# Patient Record
Sex: Male | Born: 1975 | Marital: Single | Smoking: Never smoker
Health system: Southern US, Community
[De-identification: ages and names within clinical notes are randomized; demographics above are authoritative.]

## PROBLEM LIST (undated history)

## (undated) DIAGNOSIS — I82409 Acute embolism and thrombosis of unspecified deep veins of unspecified lower extremity: Secondary | ICD-10-CM

## (undated) DIAGNOSIS — I2699 Other pulmonary embolism without acute cor pulmonale: Secondary | ICD-10-CM

## (undated) HISTORY — PX: APPENDECTOMY: SHX54

## (undated) HISTORY — PX: KNEE SURGERY: SHX244

---

## 2013-02-27 ENCOUNTER — Encounter (HOSPITAL_BASED_OUTPATIENT_CLINIC_OR_DEPARTMENT_OTHER): Payer: Self-pay | Admitting: Emergency Medicine

## 2013-02-27 ENCOUNTER — Emergency Department (HOSPITAL_BASED_OUTPATIENT_CLINIC_OR_DEPARTMENT_OTHER)
Admission: EM | Admit: 2013-02-27 | Discharge: 2013-02-27 | Disposition: A | Discharge status: Home / Self Care | Payer: BC Managed Care – PPO | Attending: Emergency Medicine | Admitting: Emergency Medicine

## 2013-02-27 DIAGNOSIS — R110 Nausea: Secondary | ICD-10-CM | POA: Insufficient documentation

## 2013-02-27 DIAGNOSIS — R209 Unspecified disturbances of skin sensation: Secondary | ICD-10-CM | POA: Insufficient documentation

## 2013-02-27 DIAGNOSIS — Z880 Allergy status to penicillin: Secondary | ICD-10-CM | POA: Insufficient documentation

## 2013-02-27 DIAGNOSIS — M62838 Other muscle spasm: Secondary | ICD-10-CM | POA: Insufficient documentation

## 2013-02-27 DIAGNOSIS — M62449 Contracture of muscle, unspecified hand: Secondary | ICD-10-CM

## 2013-02-27 LAB — BASIC METABOLIC PANEL
BUN: 17 mg/dL (ref 6–23)
CHLORIDE: 101 meq/L (ref 96–112)
CO2: 27 meq/L (ref 19–32)
CREATININE: 1.1 mg/dL (ref 0.50–1.35)
Calcium: 9.5 mg/dL (ref 8.4–10.5)
GFR calc Af Amer: >90 mL/min (ref >90)
GFR calc non Af Amer: 85 mL/min — ABNORMAL LOW (ref >90)
Glucose, Bld: 109 mg/dL — ABNORMAL HIGH (ref 70–99)
POTASSIUM: 4.1 meq/L (ref 3.7–5.3)
Sodium: 141 mEq/L (ref 137–147)

## 2013-02-27 MED ORDER — PROMETHAZINE HCL 25 MG PO TABS: 25.0000 mg | ORAL_TABLET | Freq: Four times a day (QID) | ORAL | Status: DC | PRN

## 2013-02-27 MED ORDER — ONDANSETRON 4 MG PO TBDP
4.0000 mg | ORAL_TABLET | Freq: Once | ORAL | Status: AC
  Administered 2013-02-27: 4 mg via ORAL
  Filled 2013-02-27: qty 1

## 2013-02-27 NOTE — Discharge Instructions (Signed)
Muscle Cramps and Spasms Muscle cramps and spasms occur when a muscle or muscles tighten and you have no control over this tightening (involuntary muscle contraction). They are a common problem and can develop in any muscle. The most common place is in the calf muscles of the leg. Both muscle cramps and muscle spasms are involuntary muscle contractions, but they also have differences:   Muscle cramps are sporadic and painful. They may last a few seconds to a quarter of an hour. Muscle cramps are often more forceful and last longer than muscle spasms.  Muscle spasms may or may not be painful. They may also last just a few seconds or much longer. CAUSES  It is uncommon for cramps or spasms to be due to a serious underlying problem. In many cases, the cause of cramps or spasms is unknown. Some common causes are:   Overexertion.   Overuse from repetitive motions (doing the same thing over and over).   Remaining in a certain position for a long period of time.   Improper preparation, form, or technique while performing a sport or activity.   Dehydration.   Injury.   Side effects of some medicines.   Abnormally low levels of the salts and ions in your blood (electrolytes), especially potassium and calcium. This could happen if you are taking water pills (diuretics) or you are pregnant.  Some underlying medical problems can make it more likely to develop cramps or spasms. These include, but are not limited to:   Diabetes.   Parkinson disease.   Hormone disorders, such as thyroid problems.   Alcohol abuse.   Diseases specific to muscles, joints, and bones.   Blood vessel disease where not enough blood is getting to the muscles.  HOME CARE INSTRUCTIONS   Stay well hydrated. Drink enough water and fluids to keep your urine clear or pale yellow.  It may be helpful to massage, stretch, and relax the affected muscle.  For tight or tense muscles, use a warm towel, heating  pad, or hot shower water directed to the affected area.  If you are sore or have pain after a cramp or spasm, applying ice to the affected area may relieve discomfort.  Put ice in a plastic bag.  Place a towel between your skin and the bag.  Leave the ice on for 15-20 minutes, 03-04 times a day.  Medicines used to treat a known cause of cramps or spasms may help reduce their frequency or severity. Only take over-the-counter or prescription medicines as directed by your caregiver. SEEK MEDICAL CARE IF:  Your cramps or spasms get more severe, more frequent, or do not improve over time.  MAKE SURE YOU:   Understand these instructions.  Will watch your condition.  Will get help right away if you are not doing well or get worse. Document Released: 06/18/2001 Document Revised: 04/23/2012 Document Reviewed: 12/14/2011 [facility] Patient Information 2014 [place], [place].  Nausea, Adult Nausea is the feeling that you have an upset stomach or have to vomit. Nausea by itself is not likely a serious concern, but it may be an early sign of more serious medical problems. As nausea gets worse, it can lead to vomiting. If vomiting develops, there is the risk of dehydration.  CAUSES   Viral infections.  Food poisoning.  Medicines.  Pregnancy.  Motion sickness.  Migraine headaches.  Emotional distress.  Severe pain from any source.  Alcohol intoxication. HOME CARE INSTRUCTIONS  Get plenty of rest.  Ask your caregiver  about specific rehydration instructions.  Eat small amounts of food and sip liquids more often.  Take all medicines as told by your caregiver. SEEK MEDICAL CARE IF:  You have not improved after 2 days, or you get worse.  You have a headache. SEEK IMMEDIATE MEDICAL CARE IF:   You have a fever.  You faint.  You keep vomiting or have blood in your vomit.  You are extremely weak or dehydrated.  You have dark or bloody stools.  You have severe chest or  abdominal pain. MAKE SURE YOU:  Understand these instructions.  Will watch your condition.  Will get help right away if you are not doing well or get worse. Document Released: 02/04/2004 Document Revised: 09/21/2011 Document Reviewed: 09/08/2010 [facility] Patient Information 2014 [place].

## 2013-02-27 NOTE — ED Notes (Signed)
Pt states woke up at 3a felt nauseated, no vomiting or diarrhea noted just felt like it. States became clammy and both hands/fingers locked up, states was breathing heavy; states EMS came out and VSS; pt states having nausea only at this time and feels cold; no distress noted

## 2013-02-27 NOTE — ED Provider Notes (Signed)
CSN: [id number]     Arrival date & time 02/27/13  0325 History   First MD Initiated Contact with Patient 02/27/13 [phone]     Chief Complaint  Patient presents with  . Nausea     (Consider location/radiation/quality/duration/timing/severity/associated sxs/prior Treatment) HPI Comments: Pt states woke up at 3a felt nauseated, no vomiting or diarrhea noted just felt like it. States became clammy and both hands/fingers locked up and he had some numbness. His sx lasted for few minutes - and wife called EMS. No hx of same in the past. No abd pain, no headaches, neck pain, and the sensation was limited to hands only.   The history is provided by the patient.    History reviewed. No pertinent past medical history. Past Surgical History  Procedure Laterality Date  . Appendectomy     No family history on file. History  Substance Use Topics  . Smoking status: Never Smoker   . Smokeless tobacco: Not on file  . Alcohol Use: No    Review of Systems  Constitutional: Negative for chills.  Gastrointestinal: Positive for nausea. Negative for vomiting and anal bleeding.  Musculoskeletal: Negative for back pain, myalgias, neck pain and neck stiffness.  Allergic/Immunologic: Negative for immunocompromised state.  Neurological: Positive for numbness. Negative for headaches.      Allergies  Penicillins  Home Medications  No current outpatient prescriptions on file. BP 122/82  Pulse 92  Temp(Src) 98.9 F (37.2 C) (Oral)  Resp 18  Ht 6\' 2"  (1.88 m)  Wt 225 lb (102.059 kg)  BMI 28.88 kg/m2  SpO2 100% Physical Exam  Nursing note and vitals reviewed. Constitutional: He appears well-nourished.  HENT:  Head: Atraumatic.  Eyes: Conjunctivae are normal.  Neck: Neck supple.  Cardiovascular: Normal rate.   Pulmonary/Chest: Breath sounds normal. No respiratory distress.  Abdominal: Soft. There is no tenderness.  Musculoskeletal:  gross sensory exam and grip strength of the hands are  normal.    ED Course  Procedures (including critical care time) Labs Review Labs Reviewed  BASIC METABOLIC PANEL - Abnormal; Notable for the following:    Glucose, Bld 109 (*)    GFR calc non Af Amer 85 (*)    All other components within normal limits   Imaging Review No results found.  EKG Interpretation   None       MDM   Final diagnoses:  None    Pt had an isolated episode of feeling nauseated, with some hand numbness and contracture. He is sx free, no repeat episode. Still feels nauseated. Neuromuscular exam normal. Lytes are normal. Will d.c  Derwood KaplanAnkit Livie Vanderhoof, MD 02/27/13 [phone]

## 2013-02-27 NOTE — ED Notes (Signed)
MD at bedside. 

## 2019-11-27 ENCOUNTER — Emergency Department (HOSPITAL_BASED_OUTPATIENT_CLINIC_OR_DEPARTMENT_OTHER): Payer: 59

## 2019-11-27 ENCOUNTER — Inpatient Hospital Stay (HOSPITAL_BASED_OUTPATIENT_CLINIC_OR_DEPARTMENT_OTHER)
Admission: EM | Admit: 2019-11-27 | Discharge: 2019-11-30 | DRG: 176 | Disposition: A | Discharge status: Home / Self Care | Payer: 59 | Source: Other Acute Inpatient Hospital | Attending: Internal Medicine | Admitting: Internal Medicine

## 2019-11-27 ENCOUNTER — Other Ambulatory Visit: Payer: Self-pay

## 2019-11-27 ENCOUNTER — Encounter (HOSPITAL_BASED_OUTPATIENT_CLINIC_OR_DEPARTMENT_OTHER): Payer: Self-pay | Admitting: *Deleted

## 2019-11-27 DIAGNOSIS — I2692 Saddle embolus of pulmonary artery without acute cor pulmonale: Principal | ICD-10-CM

## 2019-11-27 DIAGNOSIS — K219 Gastro-esophageal reflux disease without esophagitis: Secondary | ICD-10-CM | POA: Diagnosis present

## 2019-11-27 DIAGNOSIS — R7989 Other specified abnormal findings of blood chemistry: Secondary | ICD-10-CM | POA: Diagnosis present

## 2019-11-27 DIAGNOSIS — I82431 Acute embolism and thrombosis of right popliteal vein: Secondary | ICD-10-CM | POA: Diagnosis present

## 2019-11-27 DIAGNOSIS — I82411 Acute embolism and thrombosis of right femoral vein: Secondary | ICD-10-CM | POA: Diagnosis present

## 2019-11-27 DIAGNOSIS — L309 Dermatitis, unspecified: Secondary | ICD-10-CM | POA: Diagnosis present

## 2019-11-27 DIAGNOSIS — Z96651 Presence of right artificial knee joint: Secondary | ICD-10-CM | POA: Diagnosis present

## 2019-11-27 DIAGNOSIS — Z20822 Contact with and (suspected) exposure to covid-19: Secondary | ICD-10-CM | POA: Diagnosis present

## 2019-11-27 DIAGNOSIS — I2699 Other pulmonary embolism without acute cor pulmonale: Secondary | ICD-10-CM | POA: Diagnosis present

## 2019-11-27 DIAGNOSIS — E876 Hypokalemia: Secondary | ICD-10-CM | POA: Diagnosis not present

## 2019-11-27 DIAGNOSIS — Z880 Allergy status to penicillin: Secondary | ICD-10-CM

## 2019-11-27 DIAGNOSIS — R778 Other specified abnormalities of plasma proteins: Secondary | ICD-10-CM | POA: Diagnosis present

## 2019-11-27 LAB — BASIC METABOLIC PANEL
Anion gap: 12 (ref 5–15)
BUN: 14 mg/dL (ref 6–20)
CO2: 26 mmol/L (ref 22–32)
Calcium: 8.7 mg/dL — ABNORMAL LOW (ref 8.9–10.3)
Chloride: 101 mmol/L (ref 98–111)
Creatinine, Ser: 1.03 mg/dL (ref 0.61–1.24)
GFR, Estimated: >60 mL/min (ref >60)
Glucose, Bld: 103 mg/dL — ABNORMAL HIGH (ref 70–99)
Potassium: 3.4 mmol/L — ABNORMAL LOW (ref 3.5–5.1)
Sodium: 139 mmol/L (ref 135–145)

## 2019-11-27 LAB — CBC WITH DIFFERENTIAL/PLATELET
Abs Immature Granulocytes: 0.02 10*3/uL (ref 0.00–0.07)
Basophils Absolute: 0.1 10*3/uL (ref 0.0–0.1)
Basophils Relative: 1 %
Eosinophils Absolute: 0.4 10*3/uL (ref 0.0–0.5)
Eosinophils Relative: 6 %
HCT: 43.9 % (ref 39.0–52.0)
Hemoglobin: 14.7 g/dL (ref 13.0–17.0)
Immature Granulocytes: 0 %
Lymphocytes Relative: 28 %
Lymphs Abs: 2 10*3/uL (ref 0.7–4.0)
MCH: 28.2 pg (ref 26.0–34.0)
MCHC: 33.5 g/dL (ref 30.0–36.0)
MCV: 84.1 fL (ref 80.0–100.0)
Monocytes Absolute: 0.7 10*3/uL (ref 0.1–1.0)
Monocytes Relative: 10 %
Neutro Abs: 4.1 10*3/uL (ref 1.7–7.7)
Neutrophils Relative %: 55 %
Platelets: 230 10*3/uL (ref 150–400)
RBC: 5.22 MIL/uL (ref 4.22–5.81)
RDW: 12.2 % (ref 11.5–15.5)
WBC: 7.3 10*3/uL (ref 4.0–10.5)
nRBC: 0 % (ref 0.0–0.2)

## 2019-11-27 LAB — BRAIN NATRIURETIC PEPTIDE: B Natriuretic Peptide: 29.9 pg/mL (ref 0.0–100.0)

## 2019-11-27 LAB — RESPIRATORY PANEL BY RT PCR (FLU A&B, COVID)
Influenza A by PCR: NEGATIVE
Influenza B by PCR: NEGATIVE
SARS Coronavirus 2 by RT PCR: NEGATIVE

## 2019-11-27 LAB — LACTIC ACID, PLASMA: Lactic Acid, Venous: 0.8 mmol/L (ref 0.5–1.9)

## 2019-11-27 LAB — TROPONIN I (HIGH SENSITIVITY)
Troponin I (High Sensitivity): 39 ng/L — ABNORMAL HIGH (ref <18)
Troponin I (High Sensitivity): 33 ng/L — ABNORMAL HIGH (ref <18)

## 2019-11-27 LAB — PROTIME-INR
INR: 0.9 (ref 0.8–1.2)
Prothrombin Time: 12.1 seconds (ref 11.4–15.2)

## 2019-11-27 MED ORDER — HEPARIN (PORCINE) 25000 UT/250ML-% IV SOLN
1950.0000 [IU]/h | INTRAVENOUS | Status: AC
  Administered 2019-11-27 – 2019-11-30 (×5): 1950 [IU]/h via INTRAVENOUS
  Filled 2019-11-27 (×5): qty 250

## 2019-11-27 MED ORDER — HEPARIN BOLUS VIA INFUSION
5000.0000 [IU] | Freq: Once | INTRAVENOUS | Status: AC
  Administered 2019-11-27: 5000 [IU] via INTRAVENOUS

## 2019-11-27 MED ORDER — IOHEXOL 350 MG/ML SOLN
100.0000 mL | Freq: Once | INTRAVENOUS | Status: AC | PRN
  Administered 2019-11-27: 100 mL via INTRAVENOUS

## 2019-11-27 NOTE — ED Triage Notes (Signed)
C/o right leg swelling  And calf pain   X 3 days

## 2019-11-27 NOTE — ED Notes (Signed)
RT assessed patient in lobby due to SOB complaint. No SOB noted, stated it has been going on for 2 days. SAT 98%, HR 98. RT to monitor as needed

## 2019-11-27 NOTE — Progress Notes (Signed)
ANTICOAGULATION CONSULT NOTE - Follow Up Consult  Pharmacy Consult for Heparin Indication: pulmonary embolus  Allergies  Allergen Reactions  . Penicillins Rash    Patient Measurements: Height: 6\' 2"  (188 cm) Weight: 112.5 kg (248 lb) IBW/kg (Calculated) : 82.2 Heparin Dosing Weight: 109 kg  Vital Signs: Temp: 98.1 F (36.7 C) (11/17 1841) Temp Source: Oral (11/17 1841) BP: 126/96 (11/17 2030) Pulse Rate: 87 (11/17 2030)  Labs: Recent Labs    11/27/19 1953 11/27/19 1954  HGB 14.7  --   HCT 43.9  --   PLT 230  --   LABPROT 12.1  --   INR 0.9  --   CREATININE 1.03  --   TROPONINIHS  --  39*    Estimated Creatinine Clearance: 124.6 mL/min (by C-G formula based on SCr of 1.03 mg/dL).  Assessment: 44 year old male to begin heparin for pulmonary embolus.  Goal of Therapy:  Heparin level 0.3-0.7 units/ml Monitor platelets by anticoagulation protocol: Yes   Plan:  Heparin 5000 units iv bolus x 1 Heparin drip at 1950 units / hr  Heparin level and CBC 6 hours after heparin begins Daily heparin level, CBC  Thank you 45, PharmD [phone]  [phone] 11/27/2019,9:43 PM

## 2019-11-27 NOTE — Progress Notes (Signed)
   Call from ER Dr Stevie Kern - to CCM MD on call to help triage   PESI score 1. No syncope.   CTA - RV?LV ratio 1.3   A/P PE  - radiologicially submassive. Clinically PESI 1 - lowest risk  Plan  - ok for IV heparin gtt for atlest 3 days (to be revaluated on daily basis)( and then DOAC - lysis/intervention as rescue  - definitely recommend BNP, trop, lactate monitoring first 24h  - can go to triad  - if declines ICU      SIGNATURE    Dr. Kalman Shan, M.D., F.C.C.P,  Pulmonary and Critical Care Medicine Staff Physician, [facility] Center Director - Interstitial Lung Disease  Program  Pulmonary Fibrosis [facility] at [facility] [place], [place], [address]  Pager: [phone], If no answer  OR between  19:00-7:00h: page [phone] Telephone (clinical office): [phone] Telephone (research): [phone]  9:41 PM 11/27/2019

## 2019-11-27 NOTE — ED Provider Notes (Signed)
[facility] EMERGENCY DEPARTMENT Provider Note   CSN: [id number] Arrival date & time: 11/27/19  1829     History Chief Complaint  Patient presents with  . Leg Swelling    Peter Thomas is a 44 y.o. male.  Presents to ER with concern for leg swelling.  Patient reports over the last 3 days or so he has noted some leg swelling and calf pain.  Worse with movement, no alleviating factors.  Has not taken any medicine for it, relatively mild and achy sensation.  Also says that he has felt more short of breath lately.  Breathing worse with exertion, has slight chest pain when he takes a deep breath but no chest pain at rest, no shortness of breath at rest currently.  Had knee arthroscopic surgery over the summer.  No recent long plane rides, car rides.  Has a desk job.  Denies family history of clotting disorder.  Non-smoker.  Denies any personal medical problems.  HPI     History reviewed. No pertinent past medical history.  Patient Active Problem List   Diagnosis Date Noted  . Acute pulmonary embolism (HCC) 11/27/2019    Past Surgical History:  Procedure Laterality Date  . APPENDECTOMY         No family history on file.  Social History   Tobacco Use  . Smoking status: Never Smoker  . Smokeless tobacco: Never Used  Substance Use Topics  . Alcohol use: No  . Drug use: Not on file    Home Medications Prior to Admission medications   Medication Sig Start Date End Date Taking? Authorizing Provider  promethazine (PHENERGAN) 25 MG tablet Take 1 tablet (25 mg total) by mouth every 6 (six) hours as needed for nausea. 02/27/13   Derwood Kaplan, MD    Allergies    Penicillins  Review of Systems   Review of Systems  Constitutional: Negative for chills and fever.  HENT: Negative for ear pain and sore throat.   Eyes: Negative for pain and visual disturbance.  Respiratory: Positive for cough, chest tightness and shortness of breath.   Cardiovascular: Positive for  chest pain and leg swelling. Negative for palpitations.  Gastrointestinal: Negative for abdominal pain and vomiting.  Genitourinary: Negative for dysuria and hematuria.  Musculoskeletal: Negative for arthralgias and back pain.  Skin: Negative for color change and rash.  Neurological: Negative for seizures and syncope.  All other systems reviewed and are negative.   Physical Exam Updated Vital Signs BP (!) 125/109 (BP Location: Left Arm)   Pulse 88   Temp 98.1 F (36.7 C) (Oral)   Resp 19   Ht 6\' 2"  (1.88 m)   Wt 112.5 kg   SpO2 99%   BMI 31.84 kg/m   Physical Exam Vitals and nursing note reviewed.  Constitutional:      Appearance: He is well-developed.  HENT:     Head: Normocephalic and atraumatic.  Eyes:     Conjunctiva/sclera: Conjunctivae normal.  Cardiovascular:     Rate and Rhythm: Normal rate and regular rhythm.     Heart sounds: No murmur heard.   Pulmonary:     Effort: Pulmonary effort is normal. No respiratory distress.     Breath sounds: Normal breath sounds.  Abdominal:     Palpations: Abdomen is soft.     Tenderness: There is no abdominal tenderness.  Musculoskeletal:     Cervical back: Neck supple.     Comments: Mild swelling to the right leg, worse in  right calf, normal DP/PT pulses, sensation intact distal toes  Skin:    General: Skin is warm and dry.  Neurological:     General: No focal deficit present.     Mental Status: He is alert.     ED Results / Procedures / Treatments   Labs (all labs ordered are listed, but only abnormal results are displayed) Labs Reviewed  BASIC METABOLIC PANEL - Abnormal; Notable for the following components:      Result Value   Potassium 3.4 (*)    Glucose, Bld 103 (*)    Calcium 8.7 (*)    All other components within normal limits  TROPONIN I (HIGH SENSITIVITY) - Abnormal; Notable for the following components:   Troponin I (High Sensitivity) 39 (*)    All other components within normal limits  TROPONIN I  (HIGH SENSITIVITY) - Abnormal; Notable for the following components:   Troponin I (High Sensitivity) 33 (*)    All other components within normal limits  RESPIRATORY PANEL BY RT PCR (FLU A&B, COVID)  CBC WITH DIFFERENTIAL/PLATELET  BRAIN NATRIURETIC PEPTIDE  PROTIME-INR  HEPARIN LEVEL (UNFRACTIONATED)  CBC  LACTIC ACID, PLASMA  LACTIC ACID, PLASMA    EKG EKG Interpretation  Date/Time:  Wednesday November 27 2019 19:58:05 EST Ventricular Rate:  87 PR Interval:  156 QRS Duration: 90 QT Interval:  372 QTC Calculation: 447 R Axis:   87 Text Interpretation: Normal sinus rhythm Normal ECG Confirmed by Marianna Fuss ([id number]) on 11/27/2019 9:01:05 PM   Radiology CT Angio Chest PE W and/or Wo Contrast  Result Date: 11/27/2019 CLINICAL DATA:  Right leg swelling, calf pain EXAM: CT ANGIOGRAPHY CHEST WITH CONTRAST TECHNIQUE: Multidetector CT imaging of the chest was performed using the standard protocol during bolus administration of intravenous contrast. Multiplanar CT image reconstructions and MIPs were obtained to evaluate the vascular anatomy. CONTRAST:  OMNIPAQUE IOHEXOL 350 MG/ML SOLN COMPARISON:  None. FINDINGS: Cardiovascular: Large saddle embolus noted. Emboli extend into left upper lobe and right lower lobe. Emboli also seen in the right upper lobe. Evidence of right heart strain within RV/LV ratio of 1.3. Aorta normal caliber. Mediastinum/Nodes: No mediastinal, hilar, or axillary adenopathy. Trachea and esophagus are unremarkable. Thyroid unremarkable. Lungs/Pleura: No confluent opacities or effusions. Upper Abdomen: Imaging into the upper abdomen demonstrates no acute findings. Musculoskeletal: Chest wall soft tissues are unremarkable. No acute bony abnormality. Review of the MIP images confirms the above findings. IMPRESSION: Large saddle embolus with CT evidence of right heart strain (RV/LV Ratio = 1.3) consistent with at least submassive (intermediate risk) PE. The presence  of right heart strain has been associated with an increased risk of morbidity and mortality. Critical Value/emergent results were called by telephone at the time of interpretation on 11/27/2019 at 9:23 pm to provider [facility] , who verbally acknowledged these results. Electronically Signed   By: Charlett Nose M.D.   On: 11/27/2019 21:24   US Venous Img Lower Unilateral Right  Result Date: 11/27/2019 CLINICAL DATA:  Right lower extremity pain EXAM: RIGHT LOWER EXTREMITY VENOUS DOPPLER ULTRASOUND TECHNIQUE: Gray-scale sonography with graded compression, as well as color Doppler and duplex ultrasound were performed to evaluate the lower extremity deep venous systems from the level of the common femoral vein and including the common femoral, femoral, profunda femoral, popliteal and calf veins including the posterior tibial, peroneal and gastrocnemius veins when visible. The superficial great saphenous vein was also interrogated. Spectral Doppler was utilized to evaluate flow at rest and with distal augmentation  maneuvers in the common femoral, femoral and popliteal veins. COMPARISON:  None. FINDINGS: Contralateral Common Femoral Vein: Respiratory phasicity is normal and symmetric with the symptomatic side. No evidence of thrombus. Normal compressibility. Common Femoral Vein: No evidence of thrombus. Normal compressibility, respiratory phasicity and response to augmentation. Saphenofemoral Junction: No evidence of thrombus. Normal compressibility and flow on color Doppler imaging. Profunda Femoral Vein: No evidence of thrombus. Normal compressibility and flow on color Doppler imaging. Femoral Vein: Occlusive thrombus seen throughout the right femoral vein. Popliteal Vein: Occlusive thrombus seen throughout the right popliteal vein. Calf Veins: Not well visualized. Superficial Great Saphenous Vein: No evidence of thrombus. Normal compressibility. Venous Reflux:  None. Other Findings:  None. IMPRESSION: Right  lower extremity DVT involving the right femoral vein and popliteal vein. Electronically Signed   By: Charlett Nose M.D.   On: 11/27/2019 19:51    Procedures .Critical Care Performed by: Milagros Loll, MD Authorized by: Milagros Loll, MD   Critical care provider statement:    Critical care time (minutes):  46   Critical care was necessary to treat or prevent imminent or life-threatening deterioration of the following conditions:  Respiratory failure, circulatory failure and cardiac failure   Critical care was time spent personally by me on the following activities:  Discussions with consultants, evaluation of patient's response to treatment, examination of patient, ordering and performing treatments and interventions, ordering and review of laboratory studies, ordering and review of radiographic studies, pulse oximetry, re-evaluation of patient's condition, obtaining history from patient or surrogate and review of old charts   (including critical care time)  Medications Ordered in ED Medications  heparin ADULT infusion 100 units/mL (25000 units/275mL sodium chloride 0.45%) (1,950 Units/hr Intravenous New Bag/Given 11/27/19 2207)  iohexol (OMNIPAQUE) 350 MG/ML injection 100 mL (100 mLs Intravenous Contrast Given 11/27/19 2101)  heparin bolus via infusion 5,000 Units (5,000 Units Intravenous Bolus from Bag 11/27/19 2208)    ED Course  I have reviewed the triage vital signs and the nursing notes.  Pertinent labs & imaging results that were available during my care of the patient were reviewed by me and considered in my medical decision making (see chart for details).  Clinical Course as of Nov 27 2323  Wed Nov 27, 2019  2112 PE on CT - will start heparin, d/w pulm, likely admit hosp   [RD]  2124 D/w rad, will d/w pulm   [RD]    Clinical Course User Index [RD] Milagros Loll, MD   MDM Rules/Calculators/A&P                         44 year old male presents to ER with  concern for right leg swelling, on review of systems also endorsed shortness of breath.  Ultrasound positive for DVT in right femoral and popliteal veins.  CTA chest was positive for saddle embolism with some signs of right heart strain.  Troponin mildly elevated, no elevation in BNP.  Will start on heparin.  Will discuss with pulmonology.  Pulm recommends just heparin for now, admit [facility].  [facility] will accept.  Final Clinical Impression(s) / ED Diagnoses Final diagnoses:  Acute saddle pulmonary embolism without acute cor pulmonale (HCC)    Rx / DC Orders ED Discharge Orders    None       Milagros Loll, MD 11/27/19 2325

## 2019-11-28 ENCOUNTER — Inpatient Hospital Stay (HOSPITAL_COMMUNITY): Payer: 59

## 2019-11-28 ENCOUNTER — Encounter (HOSPITAL_COMMUNITY): Payer: Self-pay | Admitting: Family Medicine

## 2019-11-28 DIAGNOSIS — R778 Other specified abnormalities of plasma proteins: Secondary | ICD-10-CM

## 2019-11-28 DIAGNOSIS — K219 Gastro-esophageal reflux disease without esophagitis: Secondary | ICD-10-CM | POA: Diagnosis not present

## 2019-11-28 DIAGNOSIS — R7989 Other specified abnormal findings of blood chemistry: Secondary | ICD-10-CM | POA: Diagnosis present

## 2019-11-28 DIAGNOSIS — Z20822 Contact with and (suspected) exposure to covid-19: Secondary | ICD-10-CM | POA: Diagnosis present

## 2019-11-28 DIAGNOSIS — I2692 Saddle embolus of pulmonary artery without acute cor pulmonale: Secondary | ICD-10-CM

## 2019-11-28 DIAGNOSIS — I2699 Other pulmonary embolism without acute cor pulmonale: Secondary | ICD-10-CM | POA: Diagnosis present

## 2019-11-28 DIAGNOSIS — I82411 Acute embolism and thrombosis of right femoral vein: Secondary | ICD-10-CM

## 2019-11-28 DIAGNOSIS — L309 Dermatitis, unspecified: Secondary | ICD-10-CM | POA: Diagnosis present

## 2019-11-28 DIAGNOSIS — Z96651 Presence of right artificial knee joint: Secondary | ICD-10-CM | POA: Diagnosis present

## 2019-11-28 DIAGNOSIS — Z880 Allergy status to penicillin: Secondary | ICD-10-CM | POA: Diagnosis not present

## 2019-11-28 DIAGNOSIS — I82431 Acute embolism and thrombosis of right popliteal vein: Secondary | ICD-10-CM | POA: Diagnosis present

## 2019-11-28 DIAGNOSIS — E876 Hypokalemia: Secondary | ICD-10-CM | POA: Diagnosis not present

## 2019-11-28 LAB — COMPREHENSIVE METABOLIC PANEL
ALT: 21 U/L (ref 0–44)
AST: 17 U/L (ref 15–41)
Albumin: 3.6 g/dL (ref 3.5–5.0)
Alkaline Phosphatase: 49 U/L (ref 38–126)
Anion gap: 8 (ref 5–15)
BUN: 10 mg/dL (ref 6–20)
CO2: 26 mmol/L (ref 22–32)
Calcium: 8.9 mg/dL (ref 8.9–10.3)
Chloride: 107 mmol/L (ref 98–111)
Creatinine, Ser: 0.96 mg/dL (ref 0.61–1.24)
GFR, Estimated: >60 mL/min (ref >60)
Glucose, Bld: 149 mg/dL — ABNORMAL HIGH (ref 70–99)
Potassium: 3.4 mmol/L — ABNORMAL LOW (ref 3.5–5.1)
Sodium: 141 mmol/L (ref 135–145)
Total Bilirubin: 0.6 mg/dL (ref 0.3–1.2)
Total Protein: 6.4 g/dL — ABNORMAL LOW (ref 6.5–8.1)

## 2019-11-28 LAB — ECHOCARDIOGRAM COMPLETE
Area-P 1/2: 4.06 cm2
Calc EF: 50.3 %
Height: 74 in
S' Lateral: 4 cm
Single Plane A2C EF: 49.5 %
Single Plane A4C EF: 48.8 %
Weight: 3925.95 oz

## 2019-11-28 LAB — CBC
HCT: 41.1 % (ref 39.0–52.0)
Hemoglobin: 14.2 g/dL (ref 13.0–17.0)
MCH: 29 pg (ref 26.0–34.0)
MCHC: 34.5 g/dL (ref 30.0–36.0)
MCV: 83.9 fL (ref 80.0–100.0)
Platelets: 220 10*3/uL (ref 150–400)
RBC: 4.9 MIL/uL (ref 4.22–5.81)
RDW: 12.3 % (ref 11.5–15.5)
WBC: 7.7 10*3/uL (ref 4.0–10.5)
nRBC: 0 % (ref 0.0–0.2)

## 2019-11-28 LAB — HIV ANTIBODY (ROUTINE TESTING W REFLEX): HIV Screen 4th Generation wRfx: NONREACTIVE

## 2019-11-28 LAB — TROPONIN I (HIGH SENSITIVITY): Troponin I (High Sensitivity): 24 ng/L — ABNORMAL HIGH (ref <18)

## 2019-11-28 LAB — HEPARIN LEVEL (UNFRACTIONATED)
Heparin Unfractionated: 0.65 IU/mL (ref 0.30–0.70)
Heparin Unfractionated: 0.7 IU/mL (ref 0.30–0.70)

## 2019-11-28 LAB — MAGNESIUM: Magnesium: 2 mg/dL (ref 1.7–2.4)

## 2019-11-28 LAB — LACTIC ACID, PLASMA: Lactic Acid, Venous: 1 mmol/L (ref 0.5–1.9)

## 2019-11-28 MED ORDER — POTASSIUM CHLORIDE CRYS ER 20 MEQ PO TBCR
40.0000 meq | EXTENDED_RELEASE_TABLET | Freq: Once | ORAL | Status: AC
  Administered 2019-11-28: 40 meq via ORAL
  Filled 2019-11-28: qty 2

## 2019-11-28 MED ORDER — ACETAMINOPHEN 650 MG RE SUPP: 650.0000 mg | Freq: Four times a day (QID) | RECTAL | Status: DC | PRN

## 2019-11-28 MED ORDER — ACETAMINOPHEN 325 MG PO TABS: 650.0000 mg | ORAL_TABLET | Freq: Four times a day (QID) | ORAL | Status: DC | PRN

## 2019-11-28 MED ORDER — ONDANSETRON HCL 4 MG/2ML IJ SOLN: 4.0000 mg | Freq: Four times a day (QID) | INTRAMUSCULAR | Status: DC | PRN

## 2019-11-28 MED ORDER — PANTOPRAZOLE SODIUM 40 MG PO TBEC
40.0000 mg | DELAYED_RELEASE_TABLET | Freq: Every day | ORAL | Status: DC
  Administered 2019-11-28 – 2019-11-30 (×3): 40 mg via ORAL
  Filled 2019-11-28 (×3): qty 1

## 2019-11-28 MED ORDER — ONDANSETRON HCL 4 MG PO TABS: 4.0000 mg | ORAL_TABLET | Freq: Four times a day (QID) | ORAL | Status: DC | PRN

## 2019-11-28 MED ORDER — POLYETHYLENE GLYCOL 3350 17 G PO PACK: 17.0000 g | PACK | Freq: Every day | ORAL | Status: DC | PRN

## 2019-11-28 NOTE — Progress Notes (Signed)
ANTICOAGULATION CONSULT NOTE - Follow Up Consult  Pharmacy Consult for Heparin Indication: pulmonary embolus  Allergies  Allergen Reactions  . Penicillins Rash    Patient Measurements: Height: 6\' 2"  (188 cm) Weight: 111.3 kg (245 lb 6 oz) IBW/kg (Calculated) : 82.2 Heparin Dosing Weight: 109 kg  Vital Signs: Temp: 98 F (36.7 C) (11/18 0015) Temp Source: Oral (11/18 0015) BP: 128/88 (11/18 0015) Pulse Rate: 84 (11/18 0015)  Labs: Recent Labs    11/27/19 1953 11/27/19 1954 11/27/19 2202 11/28/19 0151  HGB 14.7  --   --  14.2  HCT 43.9  --   --  41.1  PLT 230  --   --  220  LABPROT 12.1  --   --   --   INR 0.9  --   --   --   HEPARINUNFRC  --   --   --  0.65  CREATININE 1.03  --   --   --   TROPONINIHS  --  39* 33*  --     Estimated Creatinine Clearance: 124 mL/min (by C-G formula based on SCr of 1.03 mg/dL).  Assessment: 44 year old male to begin heparin for pulmonary embolus  11/18 AM update:  Heparin level therapeutic x 1  CBC good  Goal of Therapy:  Heparin level 0.3-0.7 units/ml Monitor platelets by anticoagulation protocol: Yes   Plan:  Cont heparin drip at 1950 units/hr Confirmatory heparin level at 1200  12/18, PharmD, BCPS Clinical Pharmacist Phone: [phone]

## 2019-11-28 NOTE — Plan of Care (Signed)

## 2019-11-28 NOTE — Progress Notes (Signed)
Patient ID: Peter Thomas, male   DOB: Jun 01, 1975, 44 y.o.   MRN: [id number] Patient admitted early this morning for leg swelling and was found to have right lower extremity DVT and CTA chest positive for saddle embolism with some signs of right heart strain.  He was started on heparin drip.  Patient seen and examined at bedside and plan of care discussed with him.  I have reviewed patient medical records including this morning's H&P, labs, current vitals, medications myself.  Continue heparin drip for now: PCCM recommended for 3 days at least then switch to oral anticoagulation.  Repeat a.m. labs.  Check 2D echo.

## 2019-11-28 NOTE — H&P (Signed)
History and Physical    ZYEIR DYMEK IWO:[id number] DOB: April 02, 1975 DOA: 11/27/2019  PCP: Joycelyn Rua, MD  Patient coming from: Home - [facility] transfer  Chief Complaint:  Chief Complaint  Patient presents with  . Leg Swelling     HPI:    44 year old male with past medical history of gastroesophageal reflux disease and eczema who presents to [facility] as a transfer from [facility] for right lower extremity swelling found to have right lower extremity DVT as well as saddle pulmonary embolism.  Patient explains that ever since he underwent right knee arthroplasty several months ago he has been experiencing a persisting right lower extremity edema.  Patient notes that last weekend he felt quite fatigued.  By Monday, his right leg that has been swollen at least for the past several weeks began to become quite painful.  Patient describes his pain as sharp to tight in quality, severe in intensity and nonradiating.  Pain is worse with ambulation or weightbearing.  Pain persisted over the next several days.  By Tuesday, the patient began to develop mild shortness of breath, albeit only with exertion.  On Tuesday the patient presented to a local urgent care clinic where the provider told the patient he was suffering from "muscle strain" and sent the patient home.  By Wednesday, the patient began to experience substantially worsening shortness of breath both with exertion and at rest.  Patient denied associated chest pain.  Patient was experiencing some associated dry nonproductive cough as well as a headache.  Patient denies any associated hemoptysis.  Patient denies any associated substantial fevers.  Because of progressively worsening symptoms patient eventually presented to [facility] emergency department for evaluation.  Upon evaluation in the emergency department, patient underwent right lower extremity ultrasound revealing a DVT of the right femoral and  popliteal veins.  Furthermore, CT imaging of the chest revealed a large saddle pulmonary embolus with CT evidence of right heart strain consistent with at least submassive pulmonary embolism.  Patient was initiated on a heparin drip.  Case was discussed with Dr. Marchelle Gearing with CCM who did not recommend thrombolytics/thrombectomy or ICU admission at this time and recommended admitting to the hospital service.  The hospitalist service was therefore called to assess patient for admission to the hospital.  Review of Systems:   Review of Systems  Respiratory: Positive for cough and shortness of breath.   Cardiovascular: Positive for leg swelling.  Musculoskeletal: Positive for myalgias.  All other systems reviewed and are negative.   History reviewed. No pertinent past medical history.  Past Surgical History:  Procedure Laterality Date  . APPENDECTOMY       reports that he has never smoked. He has never used smokeless tobacco. He reports that he does not drink alcohol. No history on file for drug use.  Allergies  Allergen Reactions  . Penicillins Rash    Family History  Problem Relation Age of Onset  . Pulmonary embolism Neg Hx   . Cancer Neg Hx   . Heart disease Neg Hx   . Stroke Neg Hx      Prior to Admission medications   Medication Sig Start Date End Date Taking? Authorizing Provider  promethazine (PHENERGAN) 25 MG tablet Take 1 tablet (25 mg total) by mouth every 6 (six) hours as needed for nausea. 02/27/13   Derwood Kaplan, MD    Physical Exam: Vitals:   11/27/19 2330 11/28/19 0015 11/28/19 0421 11/28/19 0739  BP: Marland Kitchen)  128/98 128/88 124/82 114/88  Pulse: 89 84 99 96  Resp: (!) 21 20 19 19   Temp:  98 F (36.7 C) 98.1 F (36.7 C) 98.2 F (36.8 C)  TempSrc:  Oral Oral Oral  SpO2: 99% 98% 92% 94%  Weight:  111.3 kg    Height:  6\' 2"  (1.88 m)      Constitutional: Acute alert and oriented x3, no associated distress.   Skin: no rashes, no lesions, good skin turgor  noted. Eyes: Pupils are equally reactive to light.  No evidence of scleral icterus or conjunctival pallor.  ENMT: Moist mucous membranes noted.  Posterior pharynx clear of any exudate or lesions.   Neck: normal, supple, no masses, no thyromegaly.  No evidence of jugular venous distension.   Respiratory: clear to auscultation bilaterally, no wheezing, no crackles. Normal respiratory effort. No accessory muscle use.  Cardiovascular: Regular rate and rhythm, no murmurs / rubs / gallops.  Mild distal right lower extremity edema. 2+ pedal pulses. No carotid bruits.  Chest:   Nontender without crepitus or deformity.   Back:   Nontender without crepitus or deformity. Abdomen: Abdomen is soft and nontender.  No evidence of intra-abdominal masses.  Positive bowel sounds noted in all quadrants.   Musculoskeletal: Notable significant tenderness of the right leg, particularly in the posterior calf.  ' sign positive of the right leg.  No joint deformity upper and lower extremities. Good ROM, no contractures. Normal muscle tone.  Neurologic: CN 2-12 grossly intact. Sensation intact.  Patient moving all 4 extremities spontaneously.  Patient is following all commands.  Patient is responsive to verbal stimuli.   Psychiatric: Patient exhibits normal mood with appropriate affect.  Patient seems to possess insight as to their current situation.     Labs on Admission: I have personally reviewed following labs and imaging studies -   CBC: Recent Labs  Lab 11/27/19 1953 11/28/19 0151  WBC 7.3 7.7  NEUTROABS 4.1  --   HGB 14.7 14.2  HCT 43.9 41.1  MCV 84.1 83.9  PLT 230 220   Basic Metabolic Panel: Recent Labs  Lab 11/27/19 1953 11/28/19 0338  NA 139 141  K 3.4* 3.4*  CL 101 107  CO2 26 26  GLUCOSE 103* 149*  BUN 14 10  CREATININE 1.03 0.96  CALCIUM 8.7* 8.9  MG  --  2.0   GFR: Estimated Creatinine Clearance: 133 mL/min (by C-G formula based on SCr of 0.96 mg/dL). Liver Function  Tests: Recent Labs  Lab 11/28/19 0338  AST 17  ALT 21  ALKPHOS 49  BILITOT 0.6  PROT 6.4*  ALBUMIN 3.6   No results for input(s): LIPASE, AMYLASE in the last 168 hours. No results for input(s): AMMONIA in the last 168 hours. Coagulation Profile: Recent Labs  Lab 11/27/19 1953  INR 0.9   Cardiac Enzymes: No results for input(s): CKTOTAL, CKMB, CKMBINDEX, TROPONINI in the last 168 hours. BNP (last 3 results) No results for input(s): PROBNP in the last 8760 hours. HbA1C: No results for input(s): HGBA1C in the last 72 hours. CBG: No results for input(s): GLUCAP in the last 168 hours. Lipid Profile: No results for input(s): CHOL, HDL, LDLCALC, TRIG, CHOLHDL, LDLDIRECT in the last 72 hours. Thyroid Function Tests: No results for input(s): TSH, T4TOTAL, FREET4, T3FREE, THYROIDAB in the last 72 hours. Anemia Panel: No results for input(s): VITAMINB12, FOLATE, FERRITIN, TIBC, IRON, RETICCTPCT in the last 72 hours. Urine analysis: No results found for: COLORURINE, APPEARANCEUR, LABSPEC, PHURINE, GLUCOSEU, HGBUR,  Margarita MailBILIRUBINUR, KETONESUR, PROTEINUR, UROBILINOGEN, NITRITE, LEUKOCYTESUR  Radiological Exams on Admission - Personally Reviewed: CT Angio Chest PE W and/or Wo Contrast  Result Date: 11/27/2019 CLINICAL DATA:  Right leg swelling, calf pain EXAM: CT ANGIOGRAPHY CHEST WITH CONTRAST TECHNIQUE: Multidetector CT imaging of the chest was performed using the standard protocol during bolus administration of intravenous contrast. Multiplanar CT image reconstructions and MIPs were obtained to evaluate the vascular anatomy. CONTRAST:  100mL OMNIPAQUE IOHEXOL 350 MG/ML SOLN COMPARISON:  None. FINDINGS: Cardiovascular: Large saddle embolus noted. Emboli extend into left upper lobe and right lower lobe. Emboli also seen in the right upper lobe. Evidence of right heart strain within RV/LV ratio of 1.3. Aorta normal caliber. Mediastinum/Nodes: No mediastinal, hilar, or axillary adenopathy. Trachea  and esophagus are unremarkable. Thyroid unremarkable. Lungs/Pleura: No confluent opacities or effusions. Upper Abdomen: Imaging into the upper abdomen demonstrates no acute findings. Musculoskeletal: Chest wall soft tissues are unremarkable. No acute bony abnormality. Review of the MIP images confirms the above findings. IMPRESSION: Large saddle embolus with CT evidence of right heart strain (RV/LV Ratio = 1.3) consistent with at least submassive (intermediate risk) PE. The presence of right heart strain has been associated with an increased risk of morbidity and mortality. Critical Value/emergent results were called by telephone at the time of interpretation on 11/27/2019 at 9:23 pm to provider [facility]ARD DYKSTRA , who verbally acknowledged these results. Electronically Signed   By: Charlett NoseKevin  Dover M.D.   On: 11/27/2019 21:24   US Venous Img Lower Unilateral Right  Result Date: 11/27/2019 CLINICAL DATA:  Right lower extremity pain EXAM: RIGHT LOWER EXTREMITY VENOUS DOPPLER ULTRASOUND TECHNIQUE: Gray-scale sonography with graded compression, as well as color Doppler and duplex ultrasound were performed to evaluate the lower extremity deep venous systems from the level of the common femoral vein and including the common femoral, femoral, profunda femoral, popliteal and calf veins including the posterior tibial, peroneal and gastrocnemius veins when visible. The superficial great saphenous vein was also interrogated. Spectral Doppler was utilized to evaluate flow at rest and with distal augmentation maneuvers in the common femoral, femoral and popliteal veins. COMPARISON:  None. FINDINGS: Contralateral Common Femoral Vein: Respiratory phasicity is normal and symmetric with the symptomatic side. No evidence of thrombus. Normal compressibility. Common Femoral Vein: No evidence of thrombus. Normal compressibility, respiratory phasicity and response to augmentation. Saphenofemoral Junction: No evidence of thrombus. Normal  compressibility and flow on color Doppler imaging. Profunda Femoral Vein: No evidence of thrombus. Normal compressibility and flow on color Doppler imaging. Femoral Vein: Occlusive thrombus seen throughout the right femoral vein. Popliteal Vein: Occlusive thrombus seen throughout the right popliteal vein. Calf Veins: Not well visualized. Superficial Great Saphenous Vein: No evidence of thrombus. Normal compressibility. Venous Reflux:  None. Other Findings:  None. IMPRESSION: Right lower extremity DVT involving the right femoral vein and popliteal vein. Electronically Signed   By: Charlett NoseKevin  Dover M.D.   On: 11/27/2019 19:51    EKG: Personally reviewed.  Rhythm is normal sinus rhythm with heart rate of 87 bpm.  No dynamic ST segment changes appreciated.  Assessment/Plan Principal Problem:   Acute saddle pulmonary embolism without acute cor pulmonale (HCC)   Patient presenting with right lower extremity popliteal and femoral DVT with substantial acute saddle pulmonary embolism.  Considering the etiology, patient is complaining of at least a several week history of right lower extremity edema since he underwent arthroplasty of the right knee.  This may have served as the etiology for this thromboembolic event.  Heparin infusion has already been initiated which will be continued at this time  Per CCM recommendations, will continue patient on intravenous heparin for at least 3 days prior to transitioning patient to an oral anticoagulant  Monitoring patient in progressive unit on telemetry  Patient is currently hemodynamically stable, I agree with CCM's recommendation that patient does not necessarily need thrombectomy or thrombolytics at this time however this will be reconsidered if patient deteriorates.  Active Problems:   Acute deep vein thrombosis (DVT) of femoral vein of right lower extremity (HCC)   Please see assessment and plan above    GERD without esophagitis   Daily PPI  Patient  states he typically takes OTC regimen    Elevated troponin level not due myocardial infarction    Slight elevation of troponins likely secondary to cardiac strain from saddle embolism  Cycling cardiac enzymes  Monitoring patient on telemetry  Patient currently chest pain-free   Code Status:  Full code Family Communication: deferred   Status is: Inpatient  Remains inpatient appropriate because:IV treatments appropriate due to intensity of illness or inability to take PO and Inpatient level of care appropriate due to severity of illness   Dispo: The patient is from: Home              Anticipated d/c is to: Home              Anticipated d/c date is: > 3 days              Patient currently is not medically stable to d/c.        Marinda Elk MD [facility] Pager [phone]  If 7PM-7AM, please contact night-coverage [email or web address] Use universal [place] password for that web site. If you do not have the password, please call the hospital operator.  11/28/2019, 8:15 AM

## 2019-11-28 NOTE — Progress Notes (Signed)
ANTICOAGULATION CONSULT NOTE - Follow Up Consult  Pharmacy Consult for Heparin Indication: BL DVTs and saddle PE  Allergies  Allergen Reactions  . Penicillins Rash    Patient Measurements: Height: 6\' 2"  (188 cm) Weight: 111.3 kg (245 lb 6 oz) IBW/kg (Calculated) : 82.2 Heparin Dosing Weight:    Vital Signs: Temp: 98.2 F (36.8 C) (11/18 0739) Temp Source: Oral (11/18 0739) BP: 114/88 (11/18 0739) Pulse Rate: 96 (11/18 0739)  Labs: Recent Labs    11/27/19 1953 11/27/19 1954 11/27/19 2202 11/28/19 0151 11/28/19 0338 11/28/19 1155  HGB 14.7  --   --  14.2  --   --   HCT 43.9  --   --  41.1  --   --   PLT 230  --   --  220  --   --   LABPROT 12.1  --   --   --   --   --   INR 0.9  --   --   --   --   --   HEPARINUNFRC  --   --   --  0.65  --  0.70  CREATININE 1.03  --   --   --  0.96  --   TROPONINIHS  --  39* 33*  --  24*  --     Estimated Creatinine Clearance: 133 mL/min (by C-G formula based on SCr of 0.96 mg/dL).  Assessment: Anticoag: DVT + saddle PE. CTA - RV/LV ratio 1.3. Plan heparin x 3d then DOAC. Hep level 0.65, repeat 0.7 remains in goal  Goal of Therapy:  Heparin level 0.3-0.7 units/ml Monitor platelets by anticoagulation protocol: Yes   Plan:  Heparin drip at 1950 units / hr Daily HL and CBC   Arleth Mccullar S. 11/30/19, PharmD, BCPS Clinical Staff Pharmacist [email or web address] Merilynn Finland, Harriet Bollen Stillinger 11/28/2019,2:05 PM

## 2019-11-28 NOTE — Progress Notes (Signed)
  Echocardiogram 2D Echocardiogram has been performed.  Stark Bray Swaim 11/28/2019, 1:25 PM

## 2019-11-29 DIAGNOSIS — I82411 Acute embolism and thrombosis of right femoral vein: Secondary | ICD-10-CM

## 2019-11-29 DIAGNOSIS — K219 Gastro-esophageal reflux disease without esophagitis: Secondary | ICD-10-CM

## 2019-11-29 DIAGNOSIS — R778 Other specified abnormalities of plasma proteins: Secondary | ICD-10-CM

## 2019-11-29 LAB — HEPARIN LEVEL (UNFRACTIONATED): Heparin Unfractionated: 0.67 IU/mL (ref 0.30–0.70)

## 2019-11-29 LAB — BASIC METABOLIC PANEL
Anion gap: 11 (ref 5–15)
BUN: 11 mg/dL (ref 6–20)
CO2: 22 mmol/L (ref 22–32)
Calcium: 8.7 mg/dL — ABNORMAL LOW (ref 8.9–10.3)
Chloride: 107 mmol/L (ref 98–111)
Creatinine, Ser: 0.99 mg/dL (ref 0.61–1.24)
GFR, Estimated: >60 mL/min (ref >60)
Glucose, Bld: 104 mg/dL — ABNORMAL HIGH (ref 70–99)
Potassium: 4 mmol/L (ref 3.5–5.1)
Sodium: 140 mmol/L (ref 135–145)

## 2019-11-29 LAB — CBC
HCT: 41.7 % (ref 39.0–52.0)
Hemoglobin: 14 g/dL (ref 13.0–17.0)
MCH: 28.6 pg (ref 26.0–34.0)
MCHC: 33.6 g/dL (ref 30.0–36.0)
MCV: 85.3 fL (ref 80.0–100.0)
Platelets: 232 10*3/uL (ref 150–400)
RBC: 4.89 MIL/uL (ref 4.22–5.81)
RDW: 12.2 % (ref 11.5–15.5)
WBC: 6.5 10*3/uL (ref 4.0–10.5)
nRBC: 0 % (ref 0.0–0.2)

## 2019-11-29 LAB — MAGNESIUM: Magnesium: 1.9 mg/dL (ref 1.7–2.4)

## 2019-11-29 NOTE — Progress Notes (Signed)
Patient ID: Peter Thomas, male   DOB: 02-26-1975, 44 y.o.   MRN: [id number]  PROGRESS NOTE    Peter Thomas  WUJ:[id number] DOB: 02-26-1975 DOA: 11/27/2019 PCP: Joycelyn RuaMeyers, Stephen, MD   Brief Narrative:  44 year old male with history of GERD and eczema presented with worsening right lower extremity swelling and was found to have right lower extremity DVT as well as saddle pulmonary embolism.  Case [facility] by [facility] provider with Dr. Jocelyn Lameramaswamy/CCM who did not recommend thrombolytic/thrombectomy or ICU admission and recommended to continue heparin drip.  [facility]  Assessment & Plan:   Acute saddle pulmonary embolism without acute cor pulmonale Acute DVT of right femoral and popliteal veins -Presented with worsening right lower extremity swelling and was found to have acute right femoral and popliteal veins along with acute saddle pulmonary embolism.  Possibly provoked by right knee surgery months ago. -Case [facility] with Dr. Jocelyn Lameramaswamy/CCM who did not recommend thrombolytic/thrombectomy or ICU admission and recommended to continue heparin drip -Currently on heparin drip.  Hemodynamically stable.  Respiratory status is also stable. -2D echo showed EF of 55 to 60% - will possibly switch to oral Eliquis in the next 24 to 48 hours if remains hemodynamically stable.  Will need at least 6 months of oral anticoagulation.  Outpatient follow-up with hematology  Hypokalemia -Resolved with replacement  GERD -Continue Protonix  Elevated troponin -Troponin only slightly elevated and did not trend upwards.  Echo as below.  No chest pain.  No further work-up needed.    DVT prophylaxis: Heparin drip Code Status: Full Family Communication: None at bedside Disposition Plan: Status is: Inpatient  Remains inpatient appropriate because:Inpatient level of care appropriate due to severity of illness   Dispo: The patient is from: Home               Anticipated d/c is to: Home              Anticipated d/c date is: 1 day              Patient currently is not medically stable to d/c.   Consultants: None  Procedures: Echo as below  Antimicrobials: None   Subjective: Patient seen and examined at bedside.  Denies worsening shortness of breath, nausea, vomiting, chest pain or bleeding from any orifices.  Objective: Vitals:   11/28/19 2030 11/28/19 2358 11/29/19 0421 11/29/19 0858  BP: 126/90 119/82 112/74 125/80  Pulse: 97 90 93 82  Resp: (!) 23 20 15 16   Temp: 98.1 F (36.7 C) 98 F (36.7 C) 98.4 F (36.9 C) 97.9 F (36.6 C)  TempSrc: Oral Oral Oral Oral  SpO2: 96% 93% 94% 96%  Weight:      Height:        Intake/Output Summary (Last 24 hours) at 11/29/2019 0934 Last data filed at 11/29/2019 0400 Gross per 24 hour  Intake 697.88 ml  Output 600 ml  Net 97.88 ml   Filed Weights   11/27/19 1843 11/28/19 0015  Weight: 112.5 kg 111.3 kg    Examination:  General exam: Appears calm and comfortable  Respiratory system: Bilateral decreased breath sounds at bases Cardiovascular system: S1 & S2 heard, Rate controlled Gastrointestinal system: Abdomen is nondistended, soft and nontender. Normal bowel sounds heard. Extremities: No cyanosis, clubbing; right lower extremity edema present.  No right calf tenderness Central nervous system: Alert and oriented. No focal neurological deficits. Moving extremities Skin: No rashes, lesions or ulcers  Psychiatry: Judgement and insight appear normal. Mood & affect appropriate.     Data Reviewed: I have personally reviewed following labs and imaging studies  CBC: Recent Labs  Lab 11/27/19 1953 11/28/19 0151 11/29/19 0144  WBC 7.3 7.7 6.5  NEUTROABS 4.1  --   --   HGB 14.7 14.2 14.0  HCT 43.9 41.1 41.7  MCV 84.1 83.9 85.3  PLT 230 220 232   Basic Metabolic Panel: Recent Labs  Lab 11/27/19 1953 11/28/19 0338 11/29/19 0144  NA 139 141 140  K 3.4* 3.4* 4.0  CL 101 107  107  CO2 26 26 22   GLUCOSE 103* 149* 104*  BUN 14 10 11   CREATININE 1.03 0.96 0.99  CALCIUM 8.7* 8.9 8.7*  MG  --  2.0 1.9   GFR: Estimated Creatinine Clearance: 129 mL/min (by C-G formula based on SCr of 0.99 mg/dL). Liver Function Tests: Recent Labs  Lab 11/28/19 0338  AST 17  ALT 21  ALKPHOS 49  BILITOT 0.6  PROT 6.4*  ALBUMIN 3.6   No results for input(s): LIPASE, AMYLASE in the last 168 hours. No results for input(s): AMMONIA in the last 168 hours. Coagulation Profile: Recent Labs  Lab 11/27/19 1953  INR 0.9   Cardiac Enzymes: No results for input(s): CKTOTAL, CKMB, CKMBINDEX, TROPONINI in the last 168 hours. BNP (last 3 results) No results for input(s): PROBNP in the last 8760 hours. HbA1C: No results for input(s): HGBA1C in the last 72 hours. CBG: No results for input(s): GLUCAP in the last 168 hours. Lipid Profile: No results for input(s): CHOL, HDL, LDLCALC, TRIG, CHOLHDL, LDLDIRECT in the last 72 hours. Thyroid Function Tests: No results for input(s): TSH, T4TOTAL, FREET4, T3FREE, THYROIDAB in the last 72 hours. Anemia Panel: No results for input(s): VITAMINB12, FOLATE, FERRITIN, TIBC, IRON, RETICCTPCT in the last 72 hours. Sepsis Labs: Recent Labs  Lab 11/27/19 2329 11/28/19 2330  LATICACIDVEN 0.8 1.0    Recent Results (from the past 240 hour(s))  Respiratory Panel by RT PCR (Flu A&B, Covid) - Nasopharyngeal Swab     Status: None   Collection Time: 11/27/19  9:31 PM   Specimen: Nasopharyngeal Swab  Result Value Ref Range Status   SARS Coronavirus 2 by RT PCR NEGATIVE NEGATIVE Final    Comment: (NOTE) SARS-CoV-2 target nucleic acids are NOT DETECTED.  The SARS-CoV-2 RNA is generally detectable in upper respiratoy specimens during the acute phase of infection. The lowest concentration of SARS-CoV-2 viral copies this assay can detect is 131 copies/mL. A negative result does not preclude SARS-Cov-2 infection and should not be used as the sole  basis for treatment or other patient management decisions. A negative result may occur with  improper specimen collection/handling, submission of specimen other than nasopharyngeal swab, presence of viral mutation(s) within the areas targeted by this assay, and inadequate number of viral copies (<131 copies/mL). A negative result must be combined with clinical observations, patient history, and epidemiological information. The expected result is Negative.  Fact Sheet for Patients:  3536  Fact Sheet for Healthcare Providers:  11/29/19  This test is no t yet approved or cleared by the https://www.moore.com/ FDA and  has been authorized for detection and/or diagnosis of SARS-CoV-2 by FDA under an Emergency Use Authorization (EUA). This EUA will remain  in effect (meaning this test can be used) for the duration of the COVID-19 declaration under Section 564(b)(1) of the Act, 21 U.S.C. section 360bbb-3(b)(1), unless the authorization is terminated or revoked sooner.  Influenza A by PCR NEGATIVE NEGATIVE Final   Influenza B by PCR NEGATIVE NEGATIVE Final    Comment: (NOTE) The Xpert Xpress SARS-CoV-2/FLU/RSV assay is intended as an aid in  the diagnosis of influenza from Nasopharyngeal swab specimens and  should not be used as a sole basis for treatment. Nasal washings and  aspirates are unacceptable for Xpert Xpress SARS-CoV-2/FLU/RSV  testing.  Fact Sheet for Patients: https://www.moore.com/  Fact Sheet for Healthcare Providers: https://www.young.biz/  This test is not yet approved or cleared by the [place] FDA and  has been authorized for detection and/or diagnosis of SARS-CoV-2 by  FDA under an Emergency Use Authorization (EUA). This EUA will remain  in effect (meaning this test can be used) for the duration of the  Covid-19 declaration under Section 564(b)(1) of the  Act, 21  U.S.C. section 360bbb-3(b)(1), unless the authorization is  terminated or revoked. Performed at [facility], [address] [place], [place] [address]          Radiology Studies: CT Angio Chest PE W and/or Wo Contrast  Result Date: 11/27/2019 CLINICAL DATA:  Right leg swelling, calf pain EXAM: CT ANGIOGRAPHY CHEST WITH CONTRAST TECHNIQUE: Multidetector CT imaging of the chest was performed using the standard protocol during bolus administration of intravenous contrast. Multiplanar CT image reconstructions and MIPs were obtained to evaluate the vascular anatomy. CONTRAST:  OMNIPAQUE IOHEXOL 350 MG/ML SOLN COMPARISON:  None. FINDINGS: Cardiovascular: Large saddle embolus noted. Emboli extend into left upper lobe and right lower lobe. Emboli also seen in the right upper lobe. Evidence of right heart strain within RV/LV ratio of 1.3. Aorta normal caliber. Mediastinum/Nodes: No mediastinal, hilar, or axillary adenopathy. Trachea and esophagus are unremarkable. Thyroid unremarkable. Lungs/Pleura: No confluent opacities or effusions. Upper Abdomen: Imaging into the upper abdomen demonstrates no acute findings. Musculoskeletal: Chest wall soft tissues are unremarkable. No acute bony abnormality. Review of the MIP images confirms the above findings. IMPRESSION: Large saddle embolus with CT evidence of right heart strain (RV/LV Ratio = 1.3) consistent with at least submassive (intermediate risk) PE. The presence of right heart strain has been associated with an increased risk of morbidity and mortality. Critical Value/emergent results were called by telephone at the time of interpretation on 11/27/2019 at 9:23 pm to provider [facility] , who verbally acknowledged these results. Electronically Signed   By: Charlett Nose M.D.   On: 11/27/2019 21:24   US Venous Img Lower Unilateral Right  Result Date: 11/27/2019 CLINICAL DATA:  Right lower extremity pain EXAM: RIGHT LOWER  EXTREMITY VENOUS DOPPLER ULTRASOUND TECHNIQUE: Gray-scale sonography with graded compression, as well as color Doppler and duplex ultrasound were performed to evaluate the lower extremity deep venous systems from the level of the common femoral vein and including the common femoral, femoral, profunda femoral, popliteal and calf veins including the posterior tibial, peroneal and gastrocnemius veins when visible. The superficial great saphenous vein was also interrogated. Spectral Doppler was utilized to evaluate flow at rest and with distal augmentation maneuvers in the common femoral, femoral and popliteal veins. COMPARISON:  None. FINDINGS: Contralateral Common Femoral Vein: Respiratory phasicity is normal and symmetric with the symptomatic side. No evidence of thrombus. Normal compressibility. Common Femoral Vein: No evidence of thrombus. Normal compressibility, respiratory phasicity and response to augmentation. Saphenofemoral Junction: No evidence of thrombus. Normal compressibility and flow on color Doppler imaging. Profunda Femoral Vein: No evidence of thrombus. Normal compressibility and flow on color Doppler imaging. Femoral Vein: Occlusive thrombus  seen throughout the right femoral vein. Popliteal Vein: Occlusive thrombus seen throughout the right popliteal vein. Calf Veins: Not well visualized. Superficial Great Saphenous Vein: No evidence of thrombus. Normal compressibility. Venous Reflux:  None. Other Findings:  None. IMPRESSION: Right lower extremity DVT involving the right femoral vein and popliteal vein. Electronically Signed   By: Charlett Nose M.D.   On: 11/27/2019 19:51   ECHOCARDIOGRAM COMPLETE  Result Date: 11/28/2019    ECHOCARDIOGRAM REPORT   Patient Name:   Peter Thomas Date of Exam: 11/28/2019 Medical Rec #:  [id number]      Height:       74.0 in Accession #:    [id number]     Weight:       245.4 lb Date of Birth:  27-Feb-1975      BSA:          2.371 m Patient Age:    44 years       BP:            114/88 mmHg Patient Gender: M              HR:           93 bpm. Exam Location:  Inpatient Procedure: 2D Echo, Cardiac Doppler and Color Doppler Indications:    Pulmonary Embolus 415.19 / I26.99  History:        Patient has no prior history of Echocardiogram examinations.                 Risk Factors:Non-Smoker. GERD.  Sonographer:    Renella Cunas RDCS Referring Phys: [id number] [facility] IMPRESSIONS  1. Left ventricular ejection fraction, by estimation, is 55 to 60%. The left ventricle has normal function. The left ventricle has no regional wall motion abnormalities. Left ventricular diastolic parameters were normal.  2. Right ventricular systolic function is normal. The right ventricular size is normal. RV free wall not well visualized. Tricuspid regurgitation signal is inadequate for assessing PA pressure.  3. The mitral valve is normal in structure. No evidence of mitral valve regurgitation.  4. The aortic valve was not well visualized. Aortic valve regurgitation is not visualized. No aortic stenosis is present.  5. Aortic dilatation noted. There is dilatation of the aortic root, measuring 44 mm.  6. The inferior vena cava is normal in size with <50% respiratory variability, suggesting right atrial pressure of 8 mmHg. FINDINGS  Left Ventricle: Left ventricular ejection fraction, by estimation, is 55 to 60%. The left ventricle has normal function. The left ventricle has no regional wall motion abnormalities. The left ventricular internal cavity size was normal in size. There is  no left ventricular hypertrophy. Left ventricular diastolic parameters were normal. Right Ventricle: The right ventricular size is normal. Right vetricular wall thickness was not assessed. Right ventricular systolic function is normal. Tricuspid regurgitation signal is inadequate for assessing PA pressure. Left Atrium: Left atrial size was normal in size. Right Atrium: Right atrial size was normal in size. Pericardium: There is  no evidence of pericardial effusion. Mitral Valve: The mitral valve is normal in structure. No evidence of mitral valve regurgitation. Tricuspid Valve: The tricuspid valve is normal in structure. Tricuspid valve regurgitation is not demonstrated. Aortic Valve: The aortic valve was not well visualized. Aortic valve regurgitation is not visualized. No aortic stenosis is present. Pulmonic Valve: The pulmonic valve was not well visualized. Pulmonic valve regurgitation is not visualized. Aorta: Aortic dilatation noted. There is dilatation of the aortic root, measuring 44 mm.  Venous: The inferior vena cava is normal in size with less than 50% respiratory variability, suggesting right atrial pressure of 8 mmHg. IAS/Shunts: The interatrial septum was not well visualized.  LEFT VENTRICLE PLAX 2D LVIDd:         4.90 cm      Diastology LVIDs:         4.00 cm      LV e' medial:    8.92 cm/s LV PW:         0.70 cm      LV E/e' medial:  5.2 LV IVS:        0.70 cm      LV e' lateral:   10.80 cm/s LVOT diam:     2.60 cm      LV E/e' lateral: 4.3 LV SV:         67 LV SV Index:   28 LVOT Area:     5.31 cm  LV Volumes (MOD) LV vol d, MOD A2C: 118.0 ml LV vol d, MOD A4C: 115.0 ml LV vol s, MOD A2C: 59.6 ml LV vol s, MOD A4C: 58.9 ml LV SV MOD A2C:     58.4 ml LV SV MOD A4C:     115.0 ml LV SV MOD BP:      61.1 ml RIGHT VENTRICLE RV S prime:     15.70 cm/s TAPSE (M-mode): 2.1 cm LEFT ATRIUM             Index       RIGHT ATRIUM           Index LA diam:        3.40 cm 1.43 cm/m  RA Area:     11.60 cm LA Vol (A2C):   39.4 ml 16.62 ml/m RA Volume:   25.50 ml  10.76 ml/m LA Vol (A4C):   32.9 ml 13.88 ml/m LA Biplane Vol: 36.1 ml 15.23 ml/m  AORTIC VALVE LVOT Vmax:   66.60 cm/s LVOT Vmean:  53.500 cm/s LVOT VTI:    0.127 m  AORTA Ao Root diam: 4.40 cm Ao Asc diam:  3.40 cm MITRAL VALVE MV Area (PHT): 4.06 cm    SHUNTS MV Decel Time: 187 msec    Systemic VTI:  0.13 m MV E velocity: 46.30 cm/s  Systemic Diam: 2.60 cm MV A velocity:  46.00 cm/s MV E/A ratio:  1.01 Epifanio Lesches MD Electronically signed by Epifanio Lesches MD Signature Date/Time: 11/28/2019/7:38:00 PM    Final         Scheduled Meds: . pantoprazole  40 mg Oral Daily   Continuous Infusions: . heparin 1,950 Units/hr (11/29/19 0400)          Glade Lloyd, MD [facility] 11/29/2019, 9:34 AM

## 2019-11-29 NOTE — Discharge Instructions (Signed)
Information on my medicine - ELIQUIS (apixaban)  This medication education was reviewed with me or my healthcare representative as part of my discharge preparation. Why was Eliquis prescribed for you? Eliquis was prescribed to treat blood clots that may have been found in the veins of your legs (deep vein thrombosis) or in your lungs (pulmonary embolism) and to reduce the risk of them occurring again.  What do You need to know about Eliquis ? The starting dose is 10 mg (two 5 mg tablets) taken TWICE daily for the FIRST SEVEN (7) DAYS, then on Saturday 12/07/2019  the dose is reduced to ONE 5 mg tablet taken TWICE daily.  Eliquis may be taken with or without food.   Try to take the dose about the same time in the morning and in the evening. If you have difficulty swallowing the tablet whole please discuss with your pharmacist how to take the medication safely.  Take Eliquis exactly as prescribed and DO NOT stop taking Eliquis without talking to the doctor who prescribed the medication.  Stopping may increase your risk of developing a new blood clot.  Refill your prescription before you run out.  After discharge, you should have regular check-up appointments with your healthcare provider that is prescribing your Eliquis.    What do you do if you miss a dose? If a dose of ELIQUIS is not taken at the scheduled time, take it as soon as possible on the same day and twice-daily administration should be resumed. The dose should not be doubled to make up for a missed dose.  Important Safety Information A possible side effect of Eliquis is bleeding. You should call your healthcare provider right away if you experience any of the following: ? Bleeding from an injury or your nose that does not stop. ? Unusual colored urine (red or dark brown) or unusual colored stools (red or black). ? Unusual bruising for unknown reasons. ? A serious fall or if you hit your head (even if there is no  bleeding).  Some medicines may interact with Eliquis and might increase your risk of bleeding or clotting while on Eliquis. To help avoid this, consult your healthcare provider or pharmacist prior to using any new prescription or non-prescription medications, including herbals, vitamins, non-steroidal anti-inflammatory drugs (NSAIDs) and supplements.  This website has more information on Eliquis (apixaban): http://www.eliquis.com/eliquis/home

## 2019-11-29 NOTE — Progress Notes (Signed)
ANTICOAGULATION CONSULT NOTE - Follow Up Consult  Pharmacy Consult for Heparin Indication: BL DVTs and saddle PE  Allergies  Allergen Reactions  . Penicillins Rash    Patient Measurements: Height: 6\' 2"  (188 cm) Weight: 111.3 kg (245 lb 6 oz) IBW/kg (Calculated) : 82.2 Heparin Dosing Weight:    Vital Signs: Temp: 97.9 F (36.6 C) (11/19 0858) Temp Source: Oral (11/19 0858) BP: 125/80 (11/19 0858) Pulse Rate: 82 (11/19 0858)  Labs: Recent Labs    11/27/19 1953 11/27/19 1953 11/27/19 1954 11/27/19 2202 11/28/19 0151 11/28/19 0338 11/28/19 1155 11/29/19 0144  HGB 14.7   < >  --   --  14.2  --   --  14.0  HCT 43.9  --   --   --  41.1  --   --  41.7  PLT 230  --   --   --  220  --   --  232  LABPROT 12.1  --   --   --   --   --   --   --   INR 0.9  --   --   --   --   --   --   --   HEPARINUNFRC  --   --   --   --  0.65  --  0.70 0.67  CREATININE 1.03  --   --   --   --  0.96  --  0.99  TROPONINIHS  --   --  39* 33*  --  24*  --   --    < > = values in this interval not displayed.    Estimated Creatinine Clearance: 129 mL/min (by C-G formula based on SCr of 0.99 mg/dL).  Assessment: Anticoag: DVT + saddle PE. CTA - RV/LV ratio 1.3. Plan heparin x 3d then DOAC. Heparin level 0.67 at goal on heparin drip rate 1950 uts/hr.  CBC stable, no bleeding noted.   Goal of Therapy:  Heparin level 0.3-0.7 units/ml Monitor platelets by anticoagulation protocol: Yes   Plan:  Continue Heparin drip at 1950 units / hr Daily HL and CBC     12/01/19 Pharm.D. CPP, BCPS Clinical Pharmacist [phone] 11/29/2019 11:07 AM

## 2019-11-30 LAB — CBC
HCT: 42.7 % (ref 39.0–52.0)
Hemoglobin: 14.3 g/dL (ref 13.0–17.0)
MCH: 28.5 pg (ref 26.0–34.0)
MCHC: 33.5 g/dL (ref 30.0–36.0)
MCV: 85.1 fL (ref 80.0–100.0)
Platelets: 262 10*3/uL (ref 150–400)
RBC: 5.02 MIL/uL (ref 4.22–5.81)
RDW: 12.2 % (ref 11.5–15.5)
WBC: 8.2 10*3/uL (ref 4.0–10.5)
nRBC: 0 % (ref 0.0–0.2)

## 2019-11-30 LAB — BASIC METABOLIC PANEL
Anion gap: 10 (ref 5–15)
BUN: 16 mg/dL (ref 6–20)
CO2: 24 mmol/L (ref 22–32)
Calcium: 9.1 mg/dL (ref 8.9–10.3)
Chloride: 105 mmol/L (ref 98–111)
Creatinine, Ser: 1.07 mg/dL (ref 0.61–1.24)
GFR, Estimated: >60 mL/min (ref >60)
Glucose, Bld: 107 mg/dL — ABNORMAL HIGH (ref 70–99)
Potassium: 4 mmol/L (ref 3.5–5.1)
Sodium: 139 mmol/L (ref 135–145)

## 2019-11-30 LAB — MAGNESIUM: Magnesium: 1.7 mg/dL (ref 1.7–2.4)

## 2019-11-30 LAB — HEPARIN LEVEL (UNFRACTIONATED): Heparin Unfractionated: 0.63 IU/mL (ref 0.30–0.70)

## 2019-11-30 MED ORDER — APIXABAN 5 MG PO TABS
10.0000 mg | ORAL_TABLET | Freq: Two times a day (BID) | ORAL | Status: DC
  Administered 2019-11-30: 10 mg via ORAL
  Filled 2019-11-30: qty 2

## 2019-11-30 MED ORDER — APIXABAN (ELIQUIS) VTE STARTER PACK (10MG AND 5MG): ORAL_TABLET | ORAL | 0 refills | Status: AC

## 2019-11-30 MED ORDER — APIXABAN 5 MG PO TABS: 5.0000 mg | ORAL_TABLET | Freq: Two times a day (BID) | ORAL | Status: DC

## 2019-11-30 NOTE — Progress Notes (Addendum)
Addendum: starting DOAC this morning, 11/20. Will begin apixaban 10mg  BID x7 days followed by 5mg  BID.   ANTICOAGULATION CONSULT NOTE - Follow Up Consult  Pharmacy Consult for Heparin Indication: BL DVTs and saddle PE  Allergies  Allergen Reactions  . Penicillins Rash    Patient Measurements: Height: 6\' 2"  (188 cm) Weight: 111.3 kg (245 lb 6 oz) IBW/kg (Calculated) : 82.2 Heparin Dosing Weight:    Vital Signs: Temp: 98.5 F (36.9 C) (11/20 0746) Temp Source: Oral (11/20 0746) BP: 126/88 (11/20 0746) Pulse Rate: 92 (11/20 0746)  Labs: Recent Labs    11/27/19 1953 11/27/19 1953 11/27/19 1954 11/27/19 2202 11/28/19 0151 11/28/19 0151 11/28/19 0338 11/28/19 1155 11/29/19 0144 11/30/19 0148  HGB 14.7   < >  --   --  14.2   < >  --   --  14.0 14.3  HCT 43.9   < >  --   --  41.1  --   --   --  41.7 42.7  PLT 230   < >  --   --  220  --   --   --  232 262  LABPROT 12.1  --   --   --   --   --   --   --   --   --   INR 0.9  --   --   --   --   --   --   --   --   --   HEPARINUNFRC  --   --   --   --  0.65   < >  --  0.70 0.67 0.63  CREATININE 1.03   < >  --   --   --   --  0.96  --  0.99 1.07  TROPONINIHS  --   --  39* 33*  --   --  24*  --   --   --    < > = values in this interval not displayed.    Estimated Creatinine Clearance: 119.3 mL/min (by C-G formula based on SCr of 1.07 mg/dL).  Assessment: Anticoag: DVT + saddle PE. CTA - RV/LV ratio 1.3. Plan heparin x 3d then DOAC. Heparin level 0.67 at goal on heparin drip rate 1950 uts/hr.  CBC stable, no bleeding noted.  11/20 - AM HL therapeutic at 0.63. No s/sx bleeding noted, CBC stable.    Goal of Therapy:  Heparin level 0.3-0.7 units/ml Monitor platelets by anticoagulation protocol: Yes   Plan:  - Continue Heparin drip at 1950 units / hr - Daily HL and CBC, monitor s/sx bleeding  - Follow plans for transition to DOAC at discharge   12/01/19, PharmD PGY1 Acute Care Pharmacy Resident Please refer  to [facility] for unit-specific pharmacist

## 2019-11-30 NOTE — Discharge Summary (Signed)
Physician Discharge Summary  Peter Thomas ZOX:[id number] DOB: [birth year] DOA: 11/27/2019  PCP: Joycelyn Rua, MD  Admit date: 11/27/2019 Discharge date: 11/30/2019  Admitted From: Home Disposition: Home  Recommendations for Outpatient Follow-up:  1. Follow up with PCP in 1 week  2. Recommend outpatient evaluation and follow-up by hematology 3. Follow up in ED if symptoms worsen or new appear   Home Health: No Equipment/Devices: None  Discharge Condition: Stable CODE STATUS: Full Diet recommendation: Regular  Brief/Interim Summary: 44 year old male with history of GERD and eczema presented with worsening right lower extremity swelling and was found to have right lower extremity DVT as well as saddle pulmonary embolism.  Case was discussed by [facility] provider with Dr. Jocelyn Lamer who did not recommend thrombolytic/thrombectomy or ICU admission and recommended to continue heparin drip.  He was transferred to [facility].  During hospitalization, he tolerated heparin drip well.  He is currently hemodynamically stable with stable respiratory status.  He will be switched to oral Eliquis today and discharged home on oral Eliquis.  Discharge Diagnoses:   Acute saddle pulmonary embolism without acute cor pulmonale Acute DVT of right femoral and popliteal veins -Presented with worsening right lower extremity swelling and was found to have acute right femoral and popliteal veins along with acute saddle pulmonary embolism.  Possibly provoked by right knee surgery months ago. -Case was discussed by [facility] provider with Dr. Jocelyn Lamer who did not recommend thrombolytic/thrombectomy or ICU admission and recommended to continue heparin drip -Currently on heparin drip.  Hemodynamically stable.  Respiratory status is also stable. -2D echo showed EF of 55 to 60% - He will be switched to oral Eliquis today and discharged home on oral Eliquis.   Will need at least 6 months of oral  anticoagulation.    Recommend outpatient evaluation and follow-up with hematology  Hypokalemia -Resolved with replacement  GERD -Continue famotidine  Elevated troponin -Troponin only slightly elevated and did not trend upwards.  Echo as below.  No chest pain.  No further work-up needed.  Discharge Instructions  Discharge Instructions    Diet general   Complete by: As directed    Increase activity slowly   Complete by: As directed      Allergies as of 11/30/2019      Reactions   Penicillins Rash      Medication List    STOP taking these medications   promethazine 25 MG tablet Commonly known as: PHENERGAN     TAKE these medications   Apixaban Starter Pack (  and ) Commonly known as: ELIQUIS STARTER PACK Take as directed on package: start with two-5mg  tablets twice daily for 7 days. On day 8, switch to one-5mg  tablet twice daily.   famotidine 20 MG tablet Commonly known as: PEPCID Take 20 mg by mouth daily.   ibuprofen 200 MG tablet Commonly known as: ADVIL Take 400 mg by mouth every 6 (six) hours as needed for moderate pain.   loratadine 10 MG tablet Commonly known as: CLARITIN Take 10 mg by mouth daily.   multivitamin with minerals tablet Take 1 tablet by mouth daily.       Follow-up Information    Joycelyn Rua, MD. Schedule an appointment as soon as possible for a visit in 1 week(s).   Specialty: Family Medicine Contact information: [address] [place] [address] [phone]              Allergies  Allergen Reactions  . Penicillins Rash  Consultations:  None   Procedures/Studies: CT Angio Chest PE W and/or Wo Contrast  Result Date: 11/27/2019 CLINICAL DATA:  Right leg swelling, calf pain EXAM: CT ANGIOGRAPHY CHEST WITH CONTRAST TECHNIQUE: Multidetector CT imaging of the chest was performed using the standard protocol during bolus administration of intravenous contrast. Multiplanar CT image reconstructions  and MIPs were obtained to evaluate the vascular anatomy. CONTRAST:  OMNIPAQUE IOHEXOL 350 MG/ML SOLN COMPARISON:  None. FINDINGS: Cardiovascular: Large saddle embolus noted. Emboli extend into left upper lobe and right lower lobe. Emboli also seen in the right upper lobe. Evidence of right heart strain within RV/LV ratio of 1.3. Aorta normal caliber. Mediastinum/Nodes: No mediastinal, hilar, or axillary adenopathy. Trachea and esophagus are unremarkable. Thyroid unremarkable. Lungs/Pleura: No confluent opacities or effusions. Upper Abdomen: Imaging into the upper abdomen demonstrates no acute findings. Musculoskeletal: Chest wall soft tissues are unremarkable. No acute bony abnormality. Review of the MIP images confirms the above findings. IMPRESSION: Large saddle embolus with CT evidence of right heart strain (RV/LV Ratio = 1.3) consistent with at least submassive (intermediate risk) PE. The presence of right heart strain has been associated with an increased risk of morbidity and mortality. Critical Value/emergent results were called by telephone at the time of interpretation on 11/27/2019 at 9:23 pm to provider [facility] , who verbally acknowledged these results. Electronically Signed   By: Charlett Nose M.D.   On: 11/27/2019 21:24   US Venous Img Lower Unilateral Right  Result Date: 11/27/2019 CLINICAL DATA:  Right lower extremity pain EXAM: RIGHT LOWER EXTREMITY VENOUS DOPPLER ULTRASOUND TECHNIQUE: Gray-scale sonography with graded compression, as well as color Doppler and duplex ultrasound were performed to evaluate the lower extremity deep venous systems from the level of the common femoral vein and including the common femoral, femoral, profunda femoral, popliteal and calf veins including the posterior tibial, peroneal and gastrocnemius veins when visible. The superficial great saphenous vein was also interrogated. Spectral Doppler was utilized to evaluate flow at rest and with distal  augmentation maneuvers in the common femoral, femoral and popliteal veins. COMPARISON:  None. FINDINGS: Contralateral Common Femoral Vein: Respiratory phasicity is normal and symmetric with the symptomatic side. No evidence of thrombus. Normal compressibility. Common Femoral Vein: No evidence of thrombus. Normal compressibility, respiratory phasicity and response to augmentation. Saphenofemoral Junction: No evidence of thrombus. Normal compressibility and flow on color Doppler imaging. Profunda Femoral Vein: No evidence of thrombus. Normal compressibility and flow on color Doppler imaging. Femoral Vein: Occlusive thrombus seen throughout the right femoral vein. Popliteal Vein: Occlusive thrombus seen throughout the right popliteal vein. Calf Veins: Not well visualized. Superficial Great Saphenous Vein: No evidence of thrombus. Normal compressibility. Venous Reflux:  None. Other Findings:  None. IMPRESSION: Right lower extremity DVT involving the right femoral vein and popliteal vein. Electronically Signed   By: Charlett Nose M.D.   On: 11/27/2019 19:51   ECHOCARDIOGRAM COMPLETE  Result Date: 11/28/2019    ECHOCARDIOGRAM REPORT   Patient Name:   MARKEVION LATTIN Date of Exam: 11/28/2019 Medical Rec #:  [id number]      Height:       74.0 in Accession #:    [id number]     Weight:       245.4 lb Date of Birth:  03-01-1975      BSA:          2.371 m Patient Age:    44 years       BP:  114/88 mmHg Patient Gender: M              HR:           93 bpm. Exam Location:  Inpatient Procedure: 2D Echo, Cardiac Doppler and Color Doppler Indications:    Pulmonary Embolus 415.19 / I26.99  History:        Patient has no prior history of Echocardiogram examinations.                 Risk Factors:Non-Smoker. GERD.  Sonographer:    Renella Cunas RDCS Referring Phys: [id number] [facility] IMPRESSIONS  1. Left ventricular ejection fraction, by estimation, is 55 to 60%. The left ventricle has normal function. The left ventricle has  no regional wall motion abnormalities. Left ventricular diastolic parameters were normal.  2. Right ventricular systolic function is normal. The right ventricular size is normal. RV free wall not well visualized. Tricuspid regurgitation signal is inadequate for assessing PA pressure.  3. The mitral valve is normal in structure. No evidence of mitral valve regurgitation.  4. The aortic valve was not well visualized. Aortic valve regurgitation is not visualized. No aortic stenosis is present.  5. Aortic dilatation noted. There is dilatation of the aortic root, measuring 44 mm.  6. The inferior vena cava is normal in size with <50% respiratory variability, suggesting right atrial pressure of 8 mmHg. FINDINGS  Left Ventricle: Left ventricular ejection fraction, by estimation, is 55 to 60%. The left ventricle has normal function. The left ventricle has no regional wall motion abnormalities. The left ventricular internal cavity size was normal in size. There is  no left ventricular hypertrophy. Left ventricular diastolic parameters were normal. Right Ventricle: The right ventricular size is normal. Right vetricular wall thickness was not assessed. Right ventricular systolic function is normal. Tricuspid regurgitation signal is inadequate for assessing PA pressure. Left Atrium: Left atrial size was normal in size. Right Atrium: Right atrial size was normal in size. Pericardium: There is no evidence of pericardial effusion. Mitral Valve: The mitral valve is normal in structure. No evidence of mitral valve regurgitation. Tricuspid Valve: The tricuspid valve is normal in structure. Tricuspid valve regurgitation is not demonstrated. Aortic Valve: The aortic valve was not well visualized. Aortic valve regurgitation is not visualized. No aortic stenosis is present. Pulmonic Valve: The pulmonic valve was not well visualized. Pulmonic valve regurgitation is not visualized. Aorta: Aortic dilatation noted. There is dilatation of the  aortic root, measuring 44 mm. Venous: The inferior vena cava is normal in size with less than 50% respiratory variability, suggesting right atrial pressure of 8 mmHg. IAS/Shunts: The interatrial septum was not well visualized.  LEFT VENTRICLE PLAX 2D LVIDd:         4.90 cm      Diastology LVIDs:         4.00 cm      LV e' medial:    8.92 cm/s LV PW:         0.70 cm      LV E/e' medial:  5.2 LV IVS:        0.70 cm      LV e' lateral:   10.80 cm/s LVOT diam:     2.60 cm      LV E/e' lateral: 4.3 LV SV:         67 LV SV Index:   28 LVOT Area:     5.31 cm  LV Volumes (MOD) LV vol d, MOD A2C: 118.0 ml LV vol  d, MOD A4C: 115.0 ml LV vol s, MOD A2C: 59.6 ml LV vol s, MOD A4C: 58.9 ml LV SV MOD A2C:     58.4 ml LV SV MOD A4C:     115.0 ml LV SV MOD BP:      61.1 ml RIGHT VENTRICLE RV S prime:     15.70 cm/s TAPSE (M-mode): 2.1 cm LEFT ATRIUM             Index       RIGHT ATRIUM           Index LA diam:        3.40 cm 1.43 cm/m  RA Area:     11.60 cm LA Vol (A2C):   39.4 ml 16.62 ml/m RA Volume:   25.50 ml  10.76 ml/m LA Vol (A4C):   32.9 ml 13.88 ml/m LA Biplane Vol: 36.1 ml 15.23 ml/m  AORTIC VALVE LVOT Vmax:   66.60 cm/s LVOT Vmean:  53.500 cm/s LVOT VTI:    0.127 m  AORTA Ao Root diam: 4.40 cm Ao Asc diam:  3.40 cm MITRAL VALVE MV Area (PHT): 4.06 cm    SHUNTS MV Decel Time: 187 msec    Systemic VTI:  0.13 m MV E velocity: 46.30 cm/s  Systemic Diam: 2.60 cm MV A velocity: 46.00 cm/s MV E/A ratio:  1.01 Epifanio Lesches MD Electronically signed by Epifanio Lesches MD Signature Date/Time: 11/28/2019/7:38:00 PM    Final        Subjective: Patient seen and examined at bedside.  He denies any overnight worsening shortness of breath, nausea, vomiting, chest pain or fevers or bleeding from any orifices.  Discharge Exam: Vitals:   11/30/19 0445 11/30/19 0746  BP: 120/81 126/88  Pulse: 90 92  Resp: 16 17  Temp: 98.4 F (36.9 C) 98.5 F (36.9 C)  SpO2: 94% 94%    General: Pt is alert, awake,  not in acute distress Cardiovascular: rate controlled, S1/S2 + Respiratory: bilateral decreased breath sounds at bases Abdominal: Soft, NT, ND, bowel sounds + Extremities: Right lower extremity edema present; no cyanosis    The results of significant diagnostics from this hospitalization (including imaging, microbiology, ancillary and laboratory) are listed below for reference.     Microbiology: Recent Results (from the past 240 hour(s))  Respiratory Panel by RT PCR (Flu A&B, Covid) - Nasopharyngeal Swab     Status: None   Collection Time: 11/27/19  9:31 PM   Specimen: Nasopharyngeal Swab  Result Value Ref Range Status   SARS Coronavirus 2 by RT PCR NEGATIVE NEGATIVE Final    Comment: (NOTE) SARS-CoV-2 target nucleic acids are NOT DETECTED.  The SARS-CoV-2 RNA is generally detectable in upper respiratoy specimens during the acute phase of infection. The lowest concentration of SARS-CoV-2 viral copies this assay can detect is 131 copies/mL. A negative result does not preclude SARS-Cov-2 infection and should not be used as the sole basis for treatment or other patient management decisions. A negative result may occur with  improper specimen collection/handling, submission of specimen other than nasopharyngeal swab, presence of viral mutation(s) within the areas targeted by this assay, and inadequate number of viral copies (<131 copies/mL). A negative result must be combined with clinical observations, patient history, and epidemiological information. The expected result is Negative.  Fact Sheet for Patients:  https://www.moore.com/  Fact Sheet for Healthcare Providers:  https://www.young.biz/  This test is no t yet approved or cleared by the [place] and  has been authorized for  detection and/or diagnosis of SARS-CoV-2 by FDA under an Emergency Use Authorization (EUA). This EUA will remain  in effect (meaning this test can be  used) for the duration of the COVID-19 declaration under Section 564(b)(1) of the Act, 21 U.S.C. section 360bbb-3(b)(1), unless the authorization is terminated or revoked sooner.     Influenza A by PCR NEGATIVE NEGATIVE Final   Influenza B by PCR NEGATIVE NEGATIVE Final    Comment: (NOTE) The Xpert Xpress SARS-CoV-2/FLU/RSV assay is intended as an aid in  the diagnosis of influenza from Nasopharyngeal swab specimens and  should not be used as a sole basis for treatment. Nasal washings and  aspirates are unacceptable for Xpert Xpress SARS-CoV-2/FLU/RSV  testing.  Fact Sheet for Patients: https://www.moore.com/  Fact Sheet for Healthcare Providers: https://www.young.biz/  This test is not yet approved or cleared by the [place] FDA and  has been authorized for detection and/or diagnosis of SARS-CoV-2 by  FDA under an Emergency Use Authorization (EUA). This EUA will remain  in effect (meaning this test can be used) for the duration of the  Covid-19 declaration under Section 564(b)(1) of the Act, 21  U.S.C. section 360bbb-3(b)(1), unless the authorization is  terminated or revoked. Performed at [facility], [address] [place], [place] [address]      Labs: BNP (last 3 results) Recent Labs    11/27/19 1953  BNP 29.9   Basic Metabolic Panel: Recent Labs  Lab 11/27/19 1953 11/28/19 0338 11/29/19 0144 11/30/19 0148  NA 139 141 140 139  K 3.4* 3.4* 4.0 4.0  CL 101 107 107 105  CO2 26 26 22 24   GLUCOSE 103* 149* 104* 107*  BUN 14 10 11 16   CREATININE 1.03 0.96 0.99 1.07  CALCIUM 8.7* 8.9 8.7* 9.1  MG  --  2.0 1.9 1.7   Liver Function Tests: Recent Labs  Lab 11/28/19 0338  AST 17  ALT 21  ALKPHOS 49  BILITOT 0.6  PROT 6.4*  ALBUMIN 3.6   No results for input(s): LIPASE, AMYLASE in the last 168 hours. No results for input(s): AMMONIA in the last 168 hours. CBC: Recent Labs  Lab 11/27/19 1953  11/28/19 0151 11/29/19 0144 11/30/19 0148  WBC 7.3 7.7 6.5 8.2  NEUTROABS 4.1  --   --   --   HGB 14.7 14.2 14.0 14.3  HCT 43.9 41.1 41.7 42.7  MCV 84.1 83.9 85.3 85.1  PLT 230 220 232 262   Cardiac Enzymes: No results for input(s): CKTOTAL, CKMB, CKMBINDEX, TROPONINI in the last 168 hours. BNP: Invalid input(s): POCBNP CBG: No results for input(s): GLUCAP in the last 168 hours. D-Dimer No results for input(s): DDIMER in the last 72 hours. Hgb A1c No results for input(s): HGBA1C in the last 72 hours. Lipid Profile No results for input(s): CHOL, HDL, LDLCALC, TRIG, CHOLHDL, LDLDIRECT in the last 72 hours. Thyroid function studies No results for input(s): TSH, T4TOTAL, T3FREE, THYROIDAB in the last 72 hours.  Invalid input(s): FREET3 Anemia work up No results for input(s): VITAMINB12, FOLATE, FERRITIN, TIBC, IRON, RETICCTPCT in the last 72 hours. Urinalysis No results found for: COLORURINE, APPEARANCEUR, LABSPEC, PHURINE, GLUCOSEU, HGBUR, BILIRUBINUR, KETONESUR, PROTEINUR, UROBILINOGEN, NITRITE, LEUKOCYTESUR Sepsis Labs Invalid input(s): PROCALCITONIN,  WBC,  LACTICIDVEN Microbiology Recent Results (from the past 240 hour(s))  Respiratory Panel by RT PCR (Flu A&B, Covid) - Nasopharyngeal Swab     Status: None   Collection Time: 11/27/19  9:31 PM   Specimen: Nasopharyngeal Swab  Result Value Ref Range Status   SARS Coronavirus 2 by RT PCR NEGATIVE NEGATIVE Final    Comment: (NOTE) SARS-CoV-2 target nucleic acids are NOT DETECTED.  The SARS-CoV-2 RNA is generally detectable in upper respiratoy specimens during the acute phase of infection. The lowest concentration of SARS-CoV-2 viral copies this assay can detect is 131 copies/mL. A negative result does not preclude SARS-Cov-2 infection and should not be used as the sole basis for treatment or other patient management decisions. A negative result may occur with  improper specimen collection/handling, submission of specimen  other than nasopharyngeal swab, presence of viral mutation(s) within the areas targeted by this assay, and inadequate number of viral copies (<131 copies/mL). A negative result must be combined with clinical observations, patient history, and epidemiological information. The expected result is Negative.  Fact Sheet for Patients:  https://www.moore.com/https://www.fda.gov/media/142436/download  Fact Sheet for Healthcare Providers:  https://www.young.biz/https://www.fda.gov/media/142435/download  This test is no t yet approved or cleared by the [place] FDA and  has been authorized for detection and/or diagnosis of SARS-CoV-2 by FDA under an Emergency Use Authorization (EUA). This EUA will remain  in effect (meaning this test can be used) for the duration of the COVID-19 declaration under Section 564(b)(1) of the Act, 21 U.S.C. section 360bbb-3(b)(1), unless the authorization is terminated or revoked sooner.     Influenza A by PCR NEGATIVE NEGATIVE Final   Influenza B by PCR NEGATIVE NEGATIVE Final    Comment: (NOTE) The Xpert Xpress SARS-CoV-2/FLU/RSV assay is intended as an aid in  the diagnosis of influenza from Nasopharyngeal swab specimens and  should not be used as a sole basis for treatment. Nasal washings and  aspirates are unacceptable for Xpert Xpress SARS-CoV-2/FLU/RSV  testing.  Fact Sheet for Patients: https://www.moore.com/https://www.fda.gov/media/142436/download  Fact Sheet for Healthcare Providers: https://www.young.biz/https://www.fda.gov/media/142435/download  This test is not yet approved or cleared by the [place] FDA and  has been authorized for detection and/or diagnosis of SARS-CoV-2 by  FDA under an Emergency Use Authorization (EUA). This EUA will remain  in effect (meaning this test can be used) for the duration of the  Covid-19 declaration under Section 564(b)(1) of the Act, 21  U.S.C. section 360bbb-3(b)(1), unless the authorization is  terminated or revoked. Performed at [facility] [place], [address]  [place], [place] [id number]      Time coordinating discharge: 35 minutes  SIGNED:   Glade LloydKshitiz Renee Erb, MD  [facility] 11/30/2019, 10:04 AM

## 2019-12-09 ENCOUNTER — Telehealth: Payer: Self-pay | Admitting: *Deleted

## 2019-12-09 NOTE — Telephone Encounter (Signed)
Notified pharmacy that we were unable to complete prior authorization for xarelto as Dr Luciana Axe did not see patient, did not prescribe medication. Andree Coss, RN

## 2021-03-25 ENCOUNTER — Other Ambulatory Visit: Payer: Self-pay | Admitting: *Deleted

## 2021-04-01 NOTE — Progress Notes (Signed)
? ? ? ? ? ? ? ?[facility]  ?         OF [place]  ?History and Physical ? ? ?Adib REINHARDT LICAUSI is a 46 y.o. male who presents with right leg swelling from the knee down.  He states that in 2021, he had laparoscopic knee surgery.  He says almost immediately he had pain in the right calf that resolved within about a week.  He was found to have PE and was placed on Eliquis and he is no longer on AC.  There is no family hx of DVT.    He states he works from home and does a lot of sitting.  He states when he wears his compression, his leg swelling is not too bad, but if he does not wear compression or his shoe, he notices a considerable difference.  He does not elevate his legs regularly.   ? ?The pt is not on a statin for cholesterol management.  ?The pt is not on a daily aspirin.   Other AC:  Eliquis ?The pt is not on medication for hypertension.   ?The pt is not diabetic.   ?Tobacco hx:  never ? ?There is no family hx of AAA. ? ?PMH: ?Hx of saddle embolus ?DVT ?GERD ?Hx right knee surgery  ? ?Past Surgical History:  ?Procedure Laterality Date  ? APPENDECTOMY    ? ? ?Social History  ? ?Socioeconomic History  ? Marital status: Single  ?  Spouse name: Not on file  ? Number of children: Not on file  ? Years of education: Not on file  ? Highest education level: Not on file  ?Occupational History  ? Not on file  ?Tobacco Use  ? Smoking status: Never  ? Smokeless tobacco: Never  ?Substance and Sexual Activity  ? Alcohol use: No  ? Drug use: Not on file  ? Sexual activity: Not on file  ?Other Topics Concern  ? Not on file  ?Social History Narrative  ? Not on file  ? ?Social Determinants of Health  ? ?Financial Resource Strain: Not on file  ?Food Insecurity: Not on file  ?Transportation Needs: Not on file  ?Physical Activity: Not on file  ?Stress: Not on file  ?Social Connections: Not on file  ?Intimate Partner Violence: Not on file  ? ? ?Family History  ?Problem Relation Age of Onset  ? Pulmonary embolism Neg  Hx   ? Cancer Neg Hx   ? Heart disease Neg Hx   ? Stroke Neg Hx   ? ? ?Current Outpatient Medications  ?Medication Sig Dispense Refill  ? APIXABAN (ELIQUIS) VTE STARTER PACK (10MG  AND 5MG ) Take as directed on package: start with two-5mg  tablets twice daily for 7 days. On day 8, switch to one-5mg  tablet twice daily. 1 each 0  ? famotidine (PEPCID) 20 MG tablet Take 20 mg by mouth daily.    ? ibuprofen (ADVIL) 200 MG tablet Take 400 mg by mouth every 6 (six) hours as needed for moderate pain.    ? loratadine (CLARITIN) 10 MG tablet Take 10 mg by mouth daily.    ? Multiple Vitamins-Minerals (MULTIVITAMIN WITH MINERALS) tablet Take 1 tablet by mouth daily.    ? ?No current facility-administered medications for this visit.  ? ? ?Allergies  ?Allergen Reactions  ? Penicillins Rash  ? ? ?REVIEW OF SYSTEMS:  ? ?[X]  denotes positive finding, [ ]  denotes negative finding ?Cardiac  Comments:  ?Chest pain or chest pressure:    ?  Shortness of breath upon exertion:    ?Short of breath when lying flat:    ?Irregular heart rhythm:    ?    ?Vascular    ?Pain in calf, thigh, or hip brought on by ambulation:    ?Pain in feet at night that wakes you up from your sleep:     ?Blood clot in your veins:    ?Leg swelling:  x   ?    ?Pulmonary    ?Oxygen at home:    ?Productive cough:     ?Wheezing:     ?    ?Neurologic    ?Sudden weakness in arms or legs:     ?Sudden numbness in arms or legs:     ?Sudden onset of difficulty speaking or slurred speech:    ?Temporary loss of vision in one eye:     ?Problems with dizziness:     ?    ?Gastrointestinal    ?Blood in stool:     ?Vomited blood:     ?    ?Genitourinary    ?Burning when urinating:     ?Blood in urine:    ?    ?Psychiatric    ?Major depression:     ?    ?Hematologic    ?Bleeding problems:    ?Problems with blood clotting too easily:    ?    ?Skin    ?Rashes or ulcers:    ?    ?Constitutional    ?Fever or chills:    ? ? ?PHYSICAL EXAMINATION: ? ?Today's Vitals  ? 04/07/21 0832  ?BP:  121/85  ?Pulse: 87  ?Resp: 18  ?Temp: 98 ?F (36.7 ?C)  ?TempSrc: Temporal  ?SpO2: 96%  ?Weight: 245 lb (111.1 kg)  ?Height: 6\' 1"  (1.854 m)  ? ?Body mass index is 32.32 kg/m?. ? ? ?General:  WDWN in NAD; vital signs documented above ?Gait: Not observed ?HENT: WNL, normocephalic ?Pulmonary: normal non-labored breathing without wheezing ?Cardiac: regular HR; without carotid bruits ?Abdomen: soft, NT, no masses; aortic pulse is not palpable ?Skin: without rashes ?Vascular Exam/Pulses: ? Right Left  ?Radial 2+ (normal) 2+ (normal)  ?DP 2+ (normal) 2+ (normal)  ?PT 2+ (normal) Unable to palpate  ? ?Extremities: mild RLE swelling  ?Neurologic: A&O X 3;  moving all extremities equally ?Psychiatric:  The pt has Normal affect. ? ? ?Non-Invasive Vascular Imaging:   ?Venous duplex on 04/07/2021: ?Venous Reflux Times  ?+--------------+---------+------+-----------+------------+----------------+  ? ?RIGHT         Reflux NoRefluxReflux TimeDiameter cmsComments          ?                        Yes                                           ?+-------------+---------+------+-----------+------------+----------------+  ?CFV                     yes   >1 second                               ?+--------------+---------+------+-----------+------------+----------------+  ?FV mid                  yes   >1 second  chronic thrombus  ?+--------------+---------+------+-----------+------------+----------------+  ?FV dist                                             chronic thrombus  ?+--------------+---------+------+-----------+------------+----------------+  ?Popliteal               yes   >1 second             chronic thrombus  ?+--------------+---------+------+-----------+------------+----------------+  ?GSV at [facility]              yes    >500 ms      0.54                      ?+--------------+---------+------+-----------+------------+----------------+  ?GSV prox thigh          yes    >500 ms       0.53                      ?+--------------+---------+------+-----------+------------+----------------+  ?GSV mid thigh no                            0.56                      ?+--------------+---------+------+-----------+------------+----------------+  ?GSV dist thighno                            0.59                      ?+--------------+---------+------+-----------+------------+----------------+  ?GSV at knee   no                            0.58                      ?+--------------+---------+------+-----------+------------+----------------+  ?GSV prox calf no                            0.44                      ?+--------------+---------+------+-----------+------------+----------------+  ?SSV Pop Fossa no                            0.27                      ?+--------------+---------+------+-----------+------------+----------------+  ?SSV prox calf no                            0.25                      ?+--------------+---------+------+-----------+------------+----------------+  ?SSV mid calf  no                            0.23                      ?+--------------+---------+------+-----------+------------+----------------+  ? ?Summary:  ?Right:  ?- No evidence of superficial venous thrombosis in the right lower extremity.  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  ?   ?- Venous reflux is noted in the right common femoral vein.  ?- Venous reflux is noted in the right sapheno-femoral junction.  ?- Venous reflux is noted in the right greater saphenous vein in the  ?proximal thigh.  ?- Venous reflux is noted in the right femoral vein.  ?- Venous reflux is noted in the right popliteal vein. ? ? ?Farouk DONOVAN PERSLEY is a 46 y.o. male who presents with: right leg swelling with hx of DVT/PE ? 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  Pt does have venous reflux in the deep system as well as the GSV at the [facility] and GSV proximal thigh but no other reflux.   ?-discussed with pt about continuing to wear knee high 15-20 mmHg compression stockings  ?-given his hx of DVT, recommend daily baby asa.  It is ok to st

## 2021-04-07 ENCOUNTER — Other Ambulatory Visit: Payer: Self-pay

## 2021-04-07 ENCOUNTER — Ambulatory Visit (INDEPENDENT_AMBULATORY_CARE_PROVIDER_SITE_OTHER): Payer: Managed Care, Other (non HMO) | Admitting: Physician Assistant

## 2021-04-07 ENCOUNTER — Ambulatory Visit (HOSPITAL_COMMUNITY)
Admission: RE | Admit: 2021-04-07 | Discharge: 2021-04-07 | Disposition: A | Discharge status: Home / Self Care | Payer: Managed Care, Other (non HMO) | Source: Ambulatory Visit | Attending: Physician Assistant | Admitting: Physician Assistant

## 2021-04-07 ENCOUNTER — Encounter: Payer: Self-pay | Admitting: Physician Assistant

## 2021-04-07 VITALS — BP 121/85 | HR 87 | Temp 98.0°F | Resp 18 | Ht 73.0 in | Wt 245.0 lb

## 2021-04-07 DIAGNOSIS — M7989 Other specified soft tissue disorders: Secondary | ICD-10-CM

## 2021-04-07 DIAGNOSIS — R609 Edema, unspecified: Secondary | ICD-10-CM | POA: Diagnosis not present

## 2021-07-28 ENCOUNTER — Emergency Department (HOSPITAL_BASED_OUTPATIENT_CLINIC_OR_DEPARTMENT_OTHER): Payer: Managed Care, Other (non HMO)

## 2021-07-28 ENCOUNTER — Other Ambulatory Visit: Payer: Self-pay

## 2021-07-28 ENCOUNTER — Emergency Department (HOSPITAL_BASED_OUTPATIENT_CLINIC_OR_DEPARTMENT_OTHER)
Admission: EM | Admit: 2021-07-28 | Discharge: 2021-07-29 | Disposition: A | Discharge status: Home / Self Care | Payer: Managed Care, Other (non HMO) | Attending: Emergency Medicine | Admitting: Emergency Medicine

## 2021-07-28 ENCOUNTER — Encounter (HOSPITAL_BASED_OUTPATIENT_CLINIC_OR_DEPARTMENT_OTHER): Payer: Self-pay | Admitting: Emergency Medicine

## 2021-07-28 DIAGNOSIS — R0602 Shortness of breath: Secondary | ICD-10-CM | POA: Diagnosis present

## 2021-07-28 DIAGNOSIS — J029 Acute pharyngitis, unspecified: Secondary | ICD-10-CM | POA: Diagnosis not present

## 2021-07-28 DIAGNOSIS — R509 Fever, unspecified: Secondary | ICD-10-CM | POA: Diagnosis not present

## 2021-07-28 DIAGNOSIS — Z20822 Contact with and (suspected) exposure to covid-19: Secondary | ICD-10-CM | POA: Diagnosis not present

## 2021-07-28 DIAGNOSIS — Z7901 Long term (current) use of anticoagulants: Secondary | ICD-10-CM | POA: Insufficient documentation

## 2021-07-28 DIAGNOSIS — M791 Myalgia, unspecified site: Secondary | ICD-10-CM | POA: Insufficient documentation

## 2021-07-28 DIAGNOSIS — B349 Viral infection, unspecified: Secondary | ICD-10-CM

## 2021-07-28 HISTORY — DX: Other pulmonary embolism without acute cor pulmonale: I26.99

## 2021-07-28 MED ORDER — IOHEXOL 350 MG/ML SOLN
100.0000 mL | Freq: Once | INTRAVENOUS | Status: AC | PRN
  Administered 2021-07-28: 100 mL via INTRAVENOUS

## 2021-07-28 NOTE — ED Notes (Signed)
Patient transported to CT 

## 2021-07-28 NOTE — ED Triage Notes (Signed)
Pt c/o low grade fever and shob with runny nose and scratchy throat.

## 2021-07-28 NOTE — ED Provider Notes (Addendum)
[facility] EMERGENCY DEPARTMENT Provider Note   CSN: [id number] Arrival date & time: 07/28/21  2318     History  Chief Complaint  Patient presents with   Shortness of Breath    Peter Thomas is a 46 y.o. male.  The history is provided by the patient.  Shortness of Breath Severity:  Moderate Onset quality:  Gradual Duration:  1 week Timing:  Constant Progression:  Unchanged Context: URI   Context comment:  Body aches and scratchy throat and low grade fever Relieved by:  Nothing Worsened by:  Nothing Ineffective treatments:  None tried Associated symptoms: fever and sore throat   Associated symptoms: no abdominal pain, no chest pain, no sputum production, no vomiting and no wheezing   Risk factors: no oral contraceptive use and no prolonged immobilization        Home Medications Prior to Admission medications   Medication Sig Start Date End Date Taking? Authorizing Provider  APIXABAN (ELIQUIS) VTE STARTER PACK (10MG  AND 5MG ) Take as directed on package: start with two-5mg  tablets twice daily for 7 days. On day 8, switch to one-5mg  tablet twice daily. 11/30/19   , MD  famotidine (PEPCID) 20 MG tablet Take 20 mg by mouth daily.    [provider]  ibuprofen (ADVIL) 200 MG tablet Take 400 mg by mouth every 6 (six) hours as needed for moderate pain.    [provider]  loratadine (CLARITIN) 10 MG tablet Take 10 mg by mouth daily.    [provider]  Multiple Vitamins-Minerals (MULTIVITAMIN WITH MINERALS) tablet Take 1 tablet by mouth daily.    [provider]      Allergies    Penicillins    Review of Systems   Review of Systems  Constitutional:  Positive for fever.  HENT:  Positive for sore throat.   Eyes:  Negative for redness.  Respiratory:  Positive for shortness of breath. Negative for sputum production and wheezing.   Cardiovascular:  Negative for chest pain.  Gastrointestinal:  Negative for  abdominal pain and vomiting.  All other systems reviewed and are negative.   Physical Exam Updated Vital Signs BP (!) 144/91   Pulse 96   Temp 98.3 F (36.8 C) (Oral)   Resp 16   Ht 6\' 2"  (1.88 m)   Wt 111.1 kg   SpO2 98%   BMI 31.46 kg/m  Physical Exam Vitals and nursing note reviewed.  Constitutional:      General: He is not in acute distress.    Appearance: He is well-developed. He is not diaphoretic.  HENT:     Head: Normocephalic and atraumatic.     Nose: Nose normal.  Eyes:     Conjunctiva/sclera: Conjunctivae normal.     Pupils: Pupils are equal, round, and reactive to light.  Cardiovascular:     Rate and Rhythm: Normal rate and regular rhythm.     Pulses: Normal pulses.     Heart sounds: Normal heart sounds.  Pulmonary:     Effort: Pulmonary effort is normal.     Breath sounds: Normal breath sounds. No wheezing or rales.  Abdominal:     General: Abdomen is flat. Bowel sounds are normal.     Palpations: Abdomen is soft.     Tenderness: There is no abdominal tenderness. There is no guarding or rebound.  Musculoskeletal:        General: Normal range of motion.     Cervical back: Normal range of motion and  neck supple.  Skin:    General: Skin is warm and dry.     Capillary Refill: Capillary refill takes less than 2 seconds.  Neurological:     General: No focal deficit present.     Mental Status: He is alert and oriented to person, place, and time.  Psychiatric:        Mood and Affect: Mood normal.        Behavior: Behavior normal.     ED Results / Procedures / Treatments   Labs (all labs ordered are listed, but only abnormal results are displayed) Results for orders placed or performed during the hospital encounter of 07/28/21  CBC with Differential  Result Value Ref Range   WBC 5.9 4.0 - 10.5 K/uL   RBC 4.82 4.22 - 5.81 MIL/uL   Hemoglobin 14.0 13.0 - 17.0 g/dL   HCT 77.8 24.2 - 35.3 %   MCV 83.8 80.0 - 100.0 fL   MCH 29.0 26.0 - 34.0 pg   MCHC  34.7 30.0 - 36.0 g/dL   RDW 61.4 43.1 - 54.0 %   Platelets 248 150 - 400 K/uL   nRBC 0.0 0.0 - 0.2 %   Neutrophils Relative % 63 %   Neutro Abs 3.8 1.7 - 7.7 K/uL   Lymphocytes Relative 18 %   Lymphs Abs 1.0 0.7 - 4.0 K/uL   Monocytes Relative 15 %   Monocytes Absolute 0.9 0.1 - 1.0 K/uL   Eosinophils Relative 3 %   Eosinophils Absolute 0.2 0.0 - 0.5 K/uL   Basophils Relative 1 %   Basophils Absolute 0.1 0.0 - 0.1 K/uL   Immature Granulocytes 0 %   Abs Immature Granulocytes 0.01 0.00 - 0.07 K/uL   No results found.  EKG  EKG Interpretation  Date/Time:  Wednesday July 28 2021 23:46:17 EDT Ventricular Rate:  89 PR Interval:  167 QRS Duration: 95 QT Interval:  356 QTC Calculation: 434 R Axis:   98 Text Interpretation: Sinus rhythm Confirmed by Nicanor Alcon, Zayn Selley ([id number]) on 07/28/2021 11:47:54 PM        Radiology No results found.  Procedures Procedures    Medications Ordered in ED Medications - No data to display  ED Course/ Medical Decision Making/ A&P                           Medical Decision Making URI symptoms with fatigue and SOB, is concerned it is a recurrent PE  Amount and/or Complexity of Data Reviewed External Data Reviewed: notes.    Details: previous notes reviewed Labs: ordered.    Details: all labs reviewed: normal white count 5.9 and normal hemoglobin 14 and normal platelets 248K.Normal troponin.  Normal creatinine Radiology: ordered and independent interpretation performed.    Details: Negative for PE by me ECG/medicine tests: ordered and independent interpretation performed. Decision-making details documented in ED Course.  Risk Prescription drug management. Risk Details: I did not believe this was a PE and there is not PE on CTA.  Ruled out for MI based on time course.  No signs of anemia to explain patient's symptoms.  I believe the symptoms are viral.  Please check my chart for results of your covid swab.  Well appearing, stable for discharge  with close follow up.      Final Clinical Impression(s) / ED Diagnoses Final diagnoses:  None   Return for intractable cough, coughing up blood, fevers > 100.4 unrelieved by medication, shortness of breath, intractable  vomiting, chest pain, shortness of breath, weakness, numbness, changes in speech, facial asymmetry, abdominal pain, passing out, Inability to tolerate liquids or food, cough, altered mental status or any concerns. No signs of systemic illness or infection. The patient is nontoxic-appearing on exam and vital signs are within normal limits.  I have reviewed the triage vital signs and the nursing notes. Pertinent labs & imaging results that were available during my care of the patient were reviewed by me and considered in my medical decision making (see chart for details). After history, exam, and medical workup I feel the patient has been appropriately medically screened and is safe for discharge home. Pertinent diagnoses were discussed with the patient. Patient was given return precautions.            Kairah Leoni, MD 07/29/21 [id number]

## 2021-07-29 LAB — CBC WITH DIFFERENTIAL/PLATELET
Abs Immature Granulocytes: 0.01 10*3/uL (ref 0.00–0.07)
Basophils Absolute: 0.1 10*3/uL (ref 0.0–0.1)
Basophils Relative: 1 %
Eosinophils Absolute: 0.2 10*3/uL (ref 0.0–0.5)
Eosinophils Relative: 3 %
HCT: 40.4 % (ref 39.0–52.0)
Hemoglobin: 14 g/dL (ref 13.0–17.0)
Immature Granulocytes: 0 %
Lymphocytes Relative: 18 %
Lymphs Abs: 1 10*3/uL (ref 0.7–4.0)
MCH: 29 pg (ref 26.0–34.0)
MCHC: 34.7 g/dL (ref 30.0–36.0)
MCV: 83.8 fL (ref 80.0–100.0)
Monocytes Absolute: 0.9 10*3/uL (ref 0.1–1.0)
Monocytes Relative: 15 %
Neutro Abs: 3.8 10*3/uL (ref 1.7–7.7)
Neutrophils Relative %: 63 %
Platelets: 248 10*3/uL (ref 150–400)
RBC: 4.82 MIL/uL (ref 4.22–5.81)
RDW: 12.1 % (ref 11.5–15.5)
WBC: 5.9 10*3/uL (ref 4.0–10.5)
nRBC: 0 % (ref 0.0–0.2)

## 2021-07-29 LAB — BASIC METABOLIC PANEL
Anion gap: 6 (ref 5–15)
BUN: 15 mg/dL (ref 6–20)
CO2: 26 mmol/L (ref 22–32)
Calcium: 8.6 mg/dL — ABNORMAL LOW (ref 8.9–10.3)
Chloride: 107 mmol/L (ref 98–111)
Creatinine, Ser: 0.98 mg/dL (ref 0.61–1.24)
GFR, Estimated: >60 mL/min (ref >60)
Glucose, Bld: 132 mg/dL — ABNORMAL HIGH (ref 70–99)
Potassium: 3.5 mmol/L (ref 3.5–5.1)
Sodium: 139 mmol/L (ref 135–145)

## 2021-07-29 LAB — SARS CORONAVIRUS 2 BY RT PCR: SARS Coronavirus 2 by RT PCR: NEGATIVE

## 2021-07-29 LAB — TROPONIN I (HIGH SENSITIVITY): Troponin I (High Sensitivity): 3 ng/L (ref <18)

## 2021-10-25 IMAGING — CT CT ANGIO CHEST
2 of 8 series · 18 of 36 positions shown · IV contrast (Omnipaque)
Comparison: None.

CLINICAL DATA: Right leg swelling, calf pain

EXAM:
CT ANGIOGRAPHY CHEST WITH CONTRAST
TECHNIQUE: Multidetector CT imaging of the chest was performed using the
standard protocol during bolus administration of intravenous
contrast. Multiplanar CT image reconstructions and MIPs were
obtained to evaluate the vascular anatomy.
CONTRAST:  100mL OMNIPAQUE IOHEXOL 350 MG/ML SOLN

[Series 6: pe coronal mpr · coronal · 0.53mm/px · 1 of 146 slices shown]
[im 73/146  mediastinal]
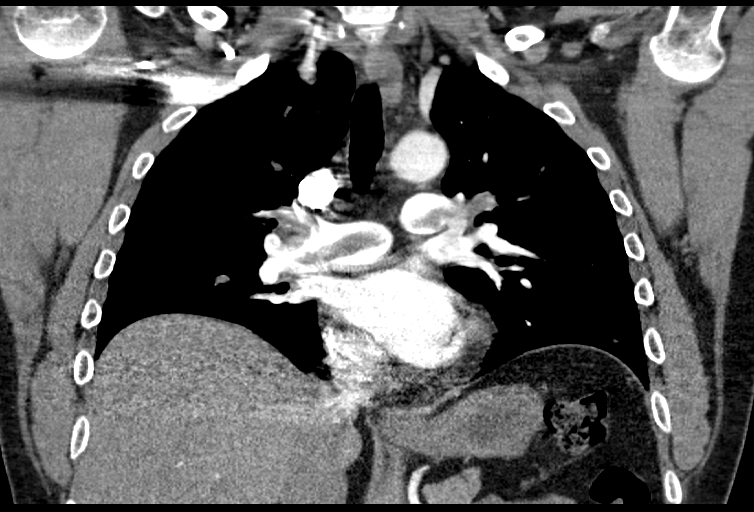

[Series 10: pe thins · axial · 0.92mm/px · z∈[+1139,+1376]mm · 17 of 265 slices shown]
[im 14/265  lung]
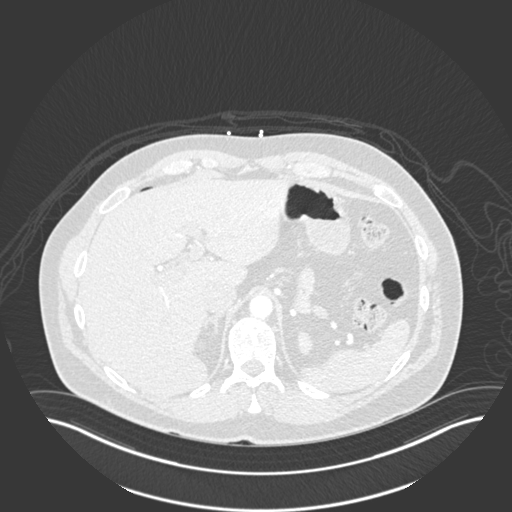
[im 28/265  mediastinal]
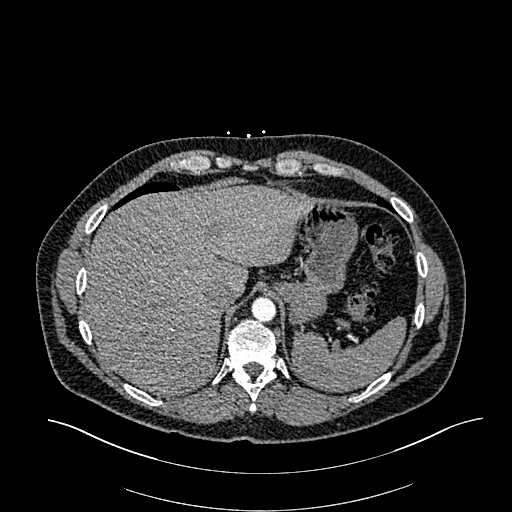
[im 42/265  lung]
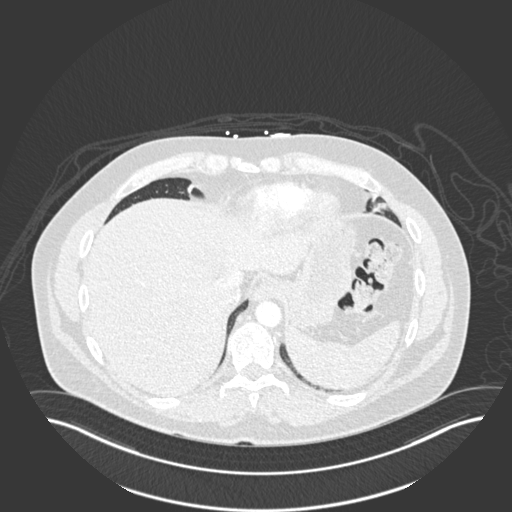
[im 56/265  mediastinal]
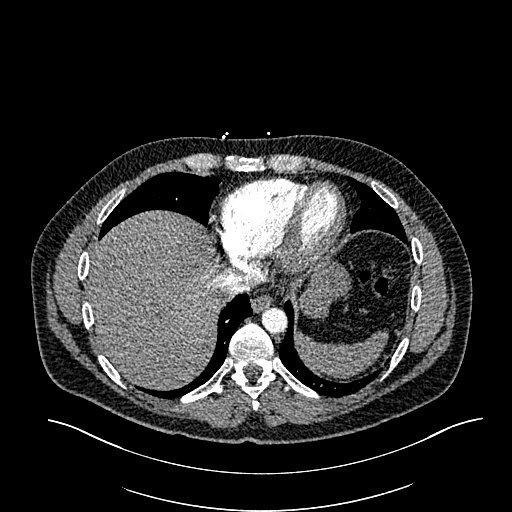
[im 70/265  lung]
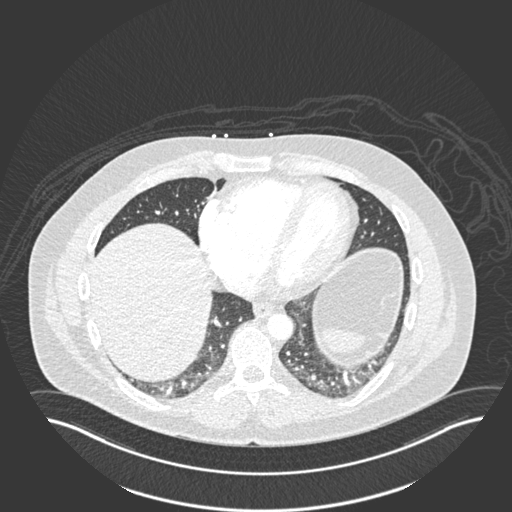
[im 84/265  mediastinal]
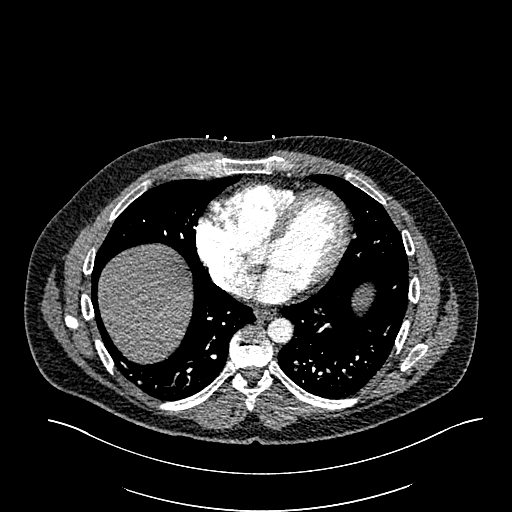
[im 98/265  lung]
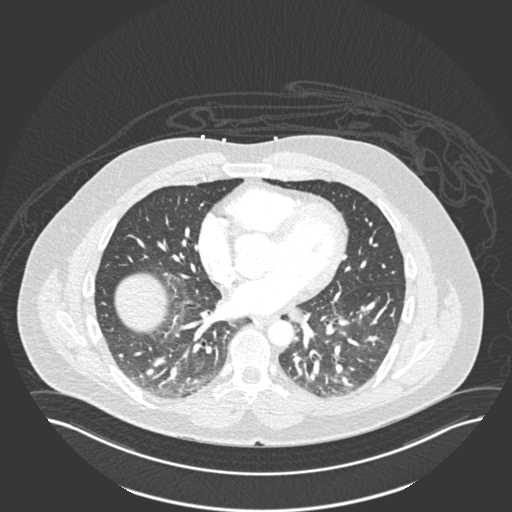
[im 112/265  mediastinal]
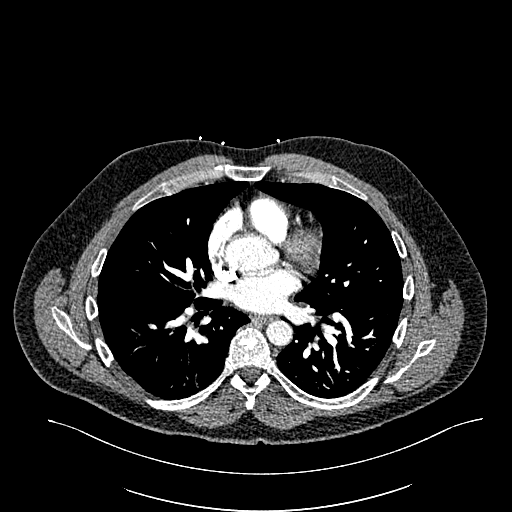
[im 139/265  lung]
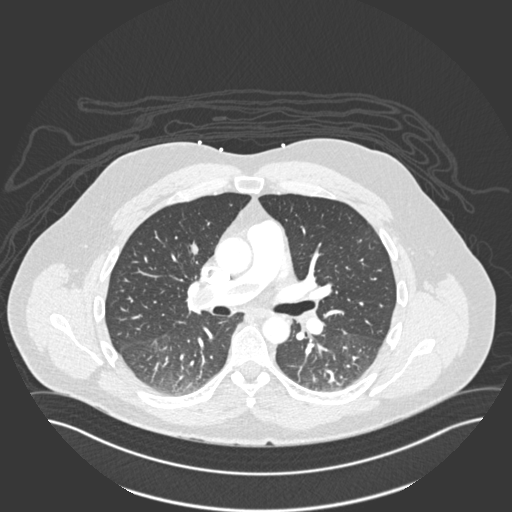
[im 153/265  mediastinal]
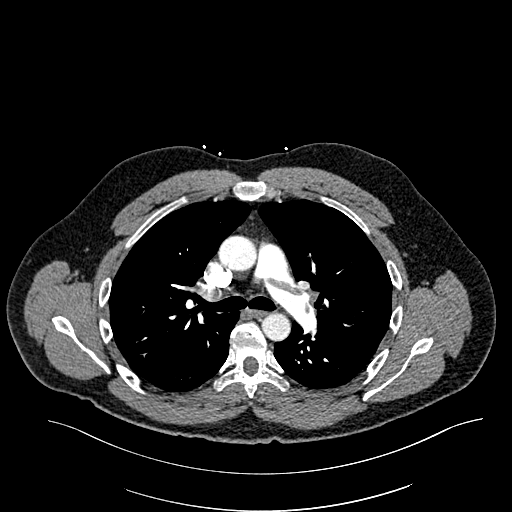
[im 167/265  lung]
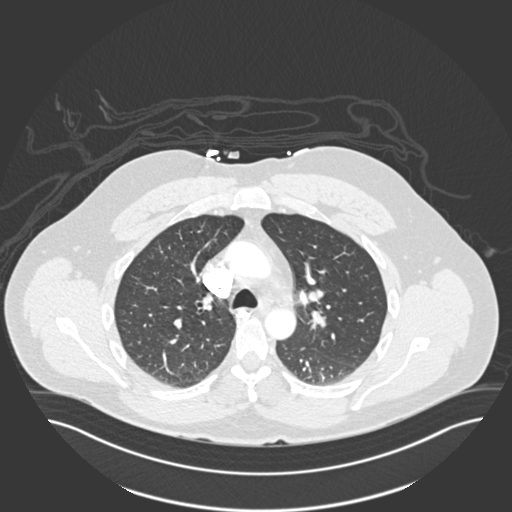
[im 181/265  mediastinal]
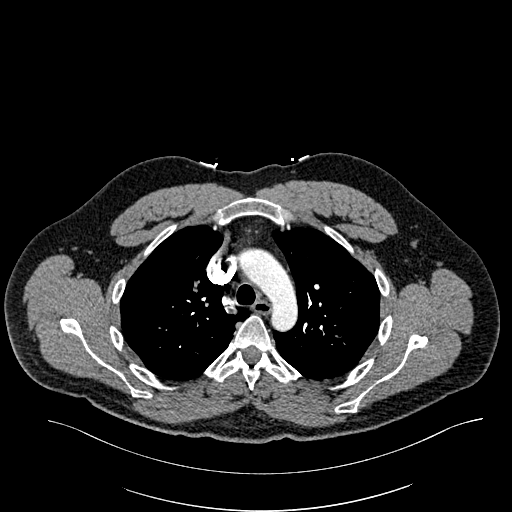
[im 195/265  lung]
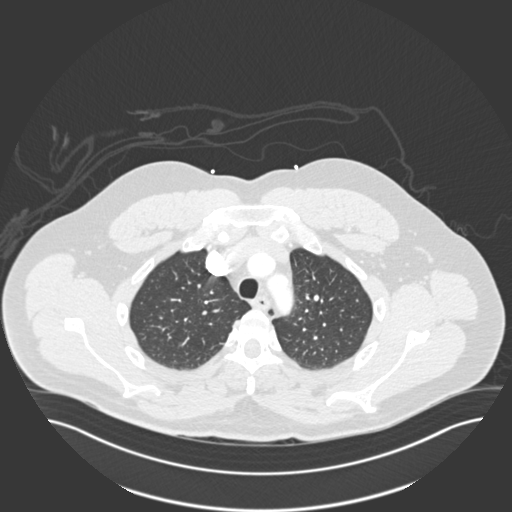
[im 209/265  mediastinal]
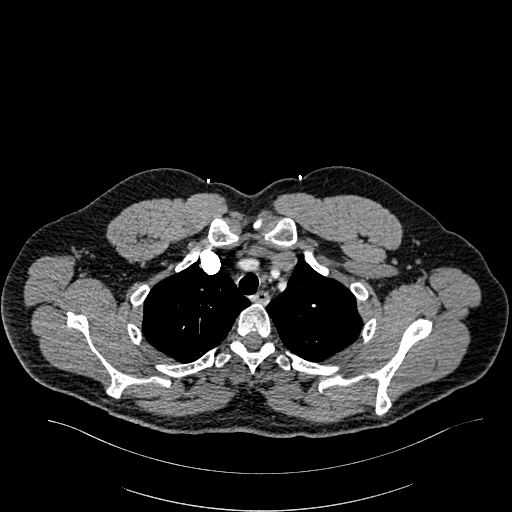
[im 223/265  lung]
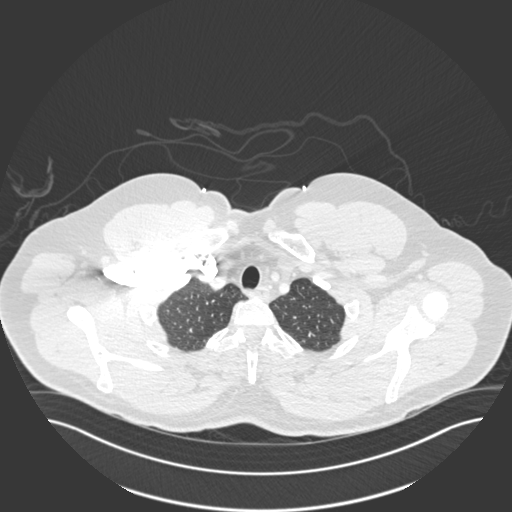
[im 237/265  mediastinal]
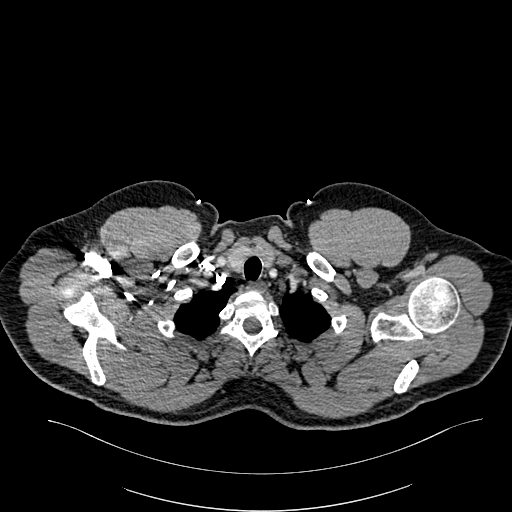
[im 251/265  lung]
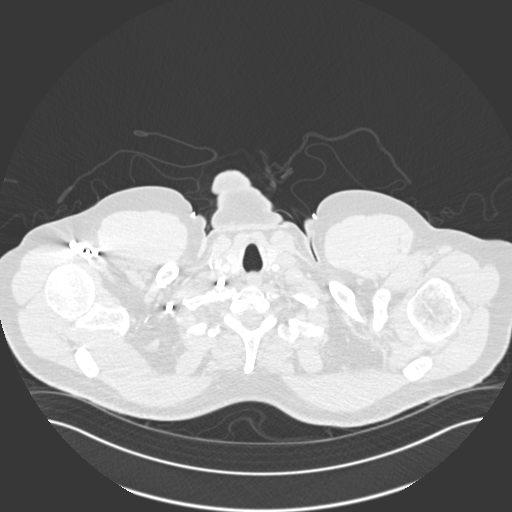

[18 of 36 positions shown; findings below may reference images not displayed]

FINDINGS: Cardiovascular: Large saddle embolus noted. Emboli extend into left
upper lobe and right lower lobe. Emboli also seen in the right upper
lobe. Evidence of right heart strain within RV/LV ratio of 1.3.
Aorta normal caliber.

Mediastinum/Nodes: No mediastinal, hilar, or axillary adenopathy.
Trachea and esophagus are unremarkable. Thyroid unremarkable.

Lungs/Pleura: No confluent opacities or effusions.

Upper Abdomen: Imaging into the upper abdomen demonstrates no acute
findings.

Musculoskeletal: Chest wall soft tissues are unremarkable. No acute
bony abnormality.

Review of the MIP images confirms the above findings.
IMPRESSION: Large saddle embolus with CT evidence of right heart strain (RV/LV
Ratio = 1.3) consistent with at least submassive (intermediate risk)
PE. The presence of right heart strain has been associated with an
increased risk of morbidity and mortality.

Critical Value/emergent results were called by telephone at the time
of interpretation on 11/27/2019 at [DATE] to provider ZAHARINA
SAMET , who verbally acknowledged these results.

## 2023-06-06 ENCOUNTER — Other Ambulatory Visit: Payer: Self-pay

## 2023-06-06 ENCOUNTER — Emergency Department (HOSPITAL_BASED_OUTPATIENT_CLINIC_OR_DEPARTMENT_OTHER)
Admission: EM | Admit: 2023-06-06 | Discharge: 2023-06-06 | Disposition: A | Discharge status: Home / Self Care | Attending: Emergency Medicine | Admitting: Emergency Medicine

## 2023-06-06 ENCOUNTER — Emergency Department (HOSPITAL_BASED_OUTPATIENT_CLINIC_OR_DEPARTMENT_OTHER)

## 2023-06-06 ENCOUNTER — Encounter (HOSPITAL_BASED_OUTPATIENT_CLINIC_OR_DEPARTMENT_OTHER): Payer: Self-pay

## 2023-06-06 DIAGNOSIS — Z7901 Long term (current) use of anticoagulants: Secondary | ICD-10-CM | POA: Insufficient documentation

## 2023-06-06 DIAGNOSIS — M79672 Pain in left foot: Secondary | ICD-10-CM | POA: Insufficient documentation

## 2023-06-06 DIAGNOSIS — M7989 Other specified soft tissue disorders: Secondary | ICD-10-CM | POA: Insufficient documentation

## 2023-06-06 HISTORY — DX: Acute embolism and thrombosis of unspecified deep veins of unspecified lower extremity: I82.409

## 2023-06-06 MED ORDER — IBUPROFEN 800 MG PO TABS
800.0000 mg | ORAL_TABLET | Freq: Once | ORAL | Status: AC
  Administered 2023-06-06: 800 mg via ORAL
  Filled 2023-06-06: qty 1

## 2023-06-06 NOTE — ED Provider Notes (Signed)
 [facility] Provider Note   CSN: [id number] Arrival date & time: 06/06/23  0844     History  Chief Complaint  Patient presents with   Foot Pain    Peter Thomas is a 48 y.o. male.  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 Work when walking and not better after taking NSAID.  He has noted some mild swelling in his left ankle.  Reports he was out golfing this weekend walking for extended period of time.  Reports he typically stationary at work as he works in [employer].  Denies any recent surgery injury trauma or fall, no recent travel, no anticoag disorder, dose not smoke. Denies CP, SOB, cough.    Foot Pain       Home Medications Prior to Admission medications   Medication Sig Start Date End Date Taking? Authorizing Provider  APIXABAN  (ELIQUIS ) VTE STARTER PACK (10MG  AND 5MG ) Take as directed on package: start with two-5mg  tablets twice daily for 7 days. On day 8, switch to one-5mg  tablet twice daily. 11/30/19   Audria Leather, MD  famotidine (PEPCID) 20 MG tablet Take 20 mg by mouth daily.    [provider]  ibuprofen (ADVIL) 200 MG tablet Take 400 mg by mouth every 6 (six) hours as needed for moderate pain.    [provider]  loratadine (CLARITIN) 10 MG tablet Take 10 mg by mouth daily.    [provider]  Multiple Vitamins-Minerals (MULTIVITAMIN WITH MINERALS) tablet Take 1 tablet by mouth daily.    [provider]      Allergies    Penicillins    Review of Systems   Review of Systems  Musculoskeletal:  Positive for arthralgias.    Physical Exam Updated Vital Signs BP (!) 129/96 (BP Location: Left Arm)   Pulse 85   Temp (!) 97.5 F (36.4 C) (Tympanic)   Resp 16   Ht 6\' 2"  (1.88 m)   Wt 119.3 kg   SpO2 98%   BMI 33.77 kg/m  Physical Exam Vitals and nursing note  reviewed.  Constitutional:      General: He is not in acute distress.    Appearance: He is not toxic-appearing.  HENT:     Head: Normocephalic and atraumatic.  Eyes:     General: No scleral icterus.    Conjunctiva/sclera: Conjunctivae normal.  Cardiovascular:     Rate and Rhythm: Normal rate and regular rhythm.     Pulses: Normal pulses.     Heart sounds: Normal heart sounds.  Pulmonary:     Effort: Pulmonary effort is normal. No respiratory distress.     Breath sounds: Normal breath sounds.  Abdominal:     General: Abdomen is flat. Bowel sounds are normal.     Palpations: Abdomen is soft.     Tenderness: There is no abdominal tenderness.  Musculoskeletal:     Right lower leg: No edema.     Left lower leg: No edema.     Comments: TTP over dorsal 3rd met, strong dorsal pedal pulse, sensation intact.   Skin:    General: Skin is warm and dry.     Findings: No lesion.  Neurological:     General: No focal deficit present.     Mental Status: He is alert and oriented to person, place, and time. Mental status is at  baseline.     ED Results / Procedures / Treatments   Labs (all labs ordered are listed, but only abnormal results are displayed) Labs Reviewed - No data to display  EKG None  Radiology US  Venous Img Lower Unilateral Left Result Date: 06/06/2023 CLINICAL DATA:  left lower leg and foot pain EXAM: LEFT LOWER EXTREMITY VENOUS DOPPLER ULTRASOUND TECHNIQUE: Gray-scale sonography with graded compression, as well as color Doppler and duplex ultrasound were performed to evaluate the lower extremity deep venous systems from the level of the common femoral vein and including the common femoral, femoral, profunda femoral, popliteal and calf veins including the posterior tibial, peroneal and gastrocnemius veins when visible. The superficial great saphenous vein was also interrogated. Spectral Doppler was utilized to evaluate flow at rest and with distal augmentation maneuvers in the  common femoral, femoral and popliteal veins. COMPARISON:  Jun 06, 2023 FINDINGS: Contralateral Common Femoral Vein: Respiratory phasicity is normal and symmetric with the symptomatic side. No evidence of thrombus. Normal compressibility. Common Femoral Vein: No evidence of thrombus. Normal compressibility, respiratory phasicity and response to augmentation. Saphenofemoral Junction: No evidence of thrombus. Normal compressibility and flow on color Doppler imaging. Profunda Femoral Vein: No evidence of thrombus. Normal compressibility and flow on color Doppler imaging. Femoral Vein: No evidence of thrombus. Normal compressibility, respiratory phasicity and response to augmentation. Popliteal Vein: No evidence of thrombus. Normal compressibility, respiratory phasicity and response to augmentation. Calf Veins: No evidence of thrombus. Normal compressibility and flow on color Doppler imaging. Superficial Great Saphenous Vein: No evidence of thrombus. Normal compressibility. Other Findings:  None. IMPRESSION: Negative for deep venous thrombosis in the left leg. Electronically Signed   By: Rance Burrows M.D.   On: 06/06/2023 12:26   DG Foot Complete Left Result Date: 06/06/2023 CLINICAL DATA:  left lower leg and foot pain EXAM: LEFT FOOT - COMPLETE 3+ VIEW COMPARISON:  None Available. FINDINGS: No acute fracture or dislocation. There is no evidence of arthropathy or other focal bone abnormality. Soft tissues are unremarkable. No radiopaque foreign body. IMPRESSION: No acute fracture or dislocation. Electronically Signed   By: Rance Burrows M.D.   On: 06/06/2023 12:10    Procedures Procedures    Medications Ordered in ED Medications - No data to display  ED Course/ Medical Decision Making/ A&P                                 Medical Decision Making Amount and/or Complexity of Data Reviewed Radiology: ordered.  Risk Prescription drug management.   This patient presents to the ED for concern of  left foot pain, this involves an extensive number of treatment options, and is a complaint that carries with it a high risk of complications and morbidity.  The differential diagnosis includes DVT, gout, septic joint, fracture    Additional history obtained:  Additional history obtained from reviewed ED note 11/27/2019 which patient seen for dvt/pe   Imaging Studies ordered:  I ordered imaging studies including left ankle x-ray, DVT study  I independently visualized and interpreted imaging which showed no acute blood clot, no acute fracture. I agree with the radiologist interpretation    Problem List / ED Course / Critical interventions / Medication management  Reporting to emergency room with complaint of left foot pain with mild swelling over his left ankle.  He has strong dorsal pedal pulse and sensation is intact.  He has history of DVT after  a surgery.  He has no current obvious risk factor for DVT. Denies injury, but walked more than normal this weekend. No wound to foot /extremity.  Patient is hemodynamically stable and well-appearing.  Symptoms are not consistent with septic joint.  Symptoms are not consistent with compartment syndrome.  Ultimately will rule out acute fracture with x-ray and DVT study to rule out blood clot.  Ibuprofen ordered for pain control. Overall, reassuring imaging results without blood clot or fracture.  Feel symptoms are most consistent with plantar fasciitis at this time.  Will treat with symptom management and anti-inflammatories follow-up with primary care. I have reviewed the patients home medicines and have made adjustments as needed   Plan  F/u w/ PCP in 2-3d to ensure resolution of sx.  Patient was given return precautions. Patient stable for discharge at this time.  Patient educated on sx/dx and verbalized understanding of plan. Return to ER w/ new or worsening sx.          Final Clinical Impression(s) / ED Diagnoses Final diagnoses:   Foot pain, left    Rx / DC Orders ED Discharge Orders     None         Eudora Heron, PA-C 06/06/23 1241    Long, Shereen Dike, MD 06/07/23 [id number]

## 2023-06-06 NOTE — Discharge Instructions (Addendum)
 Please wear supportive shoes.  Take Tylenol  for pain control.  You can do some gentle stretching of your foot and calf muscle.  Try icing the bottom of your foot.  Follow-up with primary care.

## 2023-06-06 NOTE — ED Triage Notes (Signed)
 Lateral foot pain to left foot starting last night, denies known injury. Ambulatory to triage without difficulty.   Hx of DVT after knee surgery 3 years ago.
